# Patient Record
Sex: Female | Born: 1950
Health system: Southern US, Community
[De-identification: ages and names within clinical notes are randomized; demographics above are authoritative.]

## PROBLEM LIST (undated history)

## (undated) DIAGNOSIS — R42 Dizziness and giddiness: Secondary | ICD-10-CM

## (undated) DIAGNOSIS — G4733 Obstructive sleep apnea (adult) (pediatric): Secondary | ICD-10-CM

## (undated) DIAGNOSIS — G4730 Sleep apnea, unspecified: Secondary | ICD-10-CM

## (undated) DIAGNOSIS — M1990 Unspecified osteoarthritis, unspecified site: Secondary | ICD-10-CM

## (undated) DIAGNOSIS — H9190 Unspecified hearing loss, unspecified ear: Secondary | ICD-10-CM

## (undated) DIAGNOSIS — I8390 Asymptomatic varicose veins of unspecified lower extremity: Secondary | ICD-10-CM

## (undated) DIAGNOSIS — I10 Essential (primary) hypertension: Secondary | ICD-10-CM

## (undated) DIAGNOSIS — K3184 Gastroparesis: Secondary | ICD-10-CM

## (undated) DIAGNOSIS — R32 Unspecified urinary incontinence: Secondary | ICD-10-CM

## (undated) DIAGNOSIS — F419 Anxiety disorder, unspecified: Secondary | ICD-10-CM

## (undated) DIAGNOSIS — M797 Fibromyalgia: Secondary | ICD-10-CM

## (undated) DIAGNOSIS — H269 Unspecified cataract: Secondary | ICD-10-CM

## (undated) DIAGNOSIS — Z9989 Dependence on other enabling machines and devices: Secondary | ICD-10-CM

## (undated) DIAGNOSIS — I4891 Unspecified atrial fibrillation: Secondary | ICD-10-CM

## (undated) DIAGNOSIS — H353190 Nonexudative age-related macular degeneration, unspecified eye, stage unspecified: Secondary | ICD-10-CM

## (undated) DIAGNOSIS — K635 Polyp of colon: Secondary | ICD-10-CM

## (undated) DIAGNOSIS — Z5189 Encounter for other specified aftercare: Secondary | ICD-10-CM

## (undated) DIAGNOSIS — E785 Hyperlipidemia, unspecified: Secondary | ICD-10-CM

## (undated) DIAGNOSIS — K279 Peptic ulcer, site unspecified, unspecified as acute or chronic, without hemorrhage or perforation: Secondary | ICD-10-CM

## (undated) DIAGNOSIS — E669 Obesity, unspecified: Secondary | ICD-10-CM

## (undated) DIAGNOSIS — K219 Gastro-esophageal reflux disease without esophagitis: Secondary | ICD-10-CM

## (undated) DIAGNOSIS — K5790 Diverticulosis of intestine, part unspecified, without perforation or abscess without bleeding: Secondary | ICD-10-CM

## (undated) HISTORY — PX: BILROTH I PROCEDURE: SHX1231

## (undated) HISTORY — PX: COLONOSCOPY: SHX174

## (undated) HISTORY — DX: Gastro-esophageal reflux disease without esophagitis: K21.9

## (undated) HISTORY — PX: TOTAL ABDOMINAL HYSTERECTOMY: SHX209

## (undated) HISTORY — DX: Unspecified hearing loss, unspecified ear: H91.90

## (undated) HISTORY — DX: Sleep apnea, unspecified: G47.30

## (undated) HISTORY — PX: CATARACT EXTRACTION: SUR2

## (undated) HISTORY — DX: Polyp of colon: K63.5

## (undated) HISTORY — DX: Essential (primary) hypertension: I10

## (undated) HISTORY — DX: Obesity, unspecified: E66.9

## (undated) HISTORY — PX: LAPAROSCOPIC TUBAL LIGATION: SUR803

## (undated) HISTORY — DX: Diverticulosis of intestine, part unspecified, without perforation or abscess without bleeding: K57.90

## (undated) HISTORY — DX: Dizziness and giddiness: R42

## (undated) HISTORY — DX: Unspecified osteoarthritis, unspecified site: M19.90

## (undated) HISTORY — DX: Nonexudative age-related macular degeneration, unspecified eye, stage unspecified: H35.3190

## (undated) HISTORY — PX: TONSILLECTOMY: SUR1361

## (undated) HISTORY — DX: Anxiety disorder, unspecified: F41.9

## (undated) HISTORY — DX: Encounter for other specified aftercare: Z51.89

## (undated) HISTORY — DX: Unspecified atrial fibrillation: I48.91

## (undated) HISTORY — DX: Fibromyalgia: M79.7

## (undated) HISTORY — DX: Unspecified cataract: H26.9

## (undated) HISTORY — DX: Peptic ulcer, site unspecified, unspecified as acute or chronic, without hemorrhage or perforation: K27.9

## (undated) HISTORY — DX: Gastroparesis: K31.84

## (undated) HISTORY — DX: Hyperlipidemia, unspecified: E78.5

## (undated) HISTORY — PX: OTHER SURGICAL HISTORY: SHX169

## (undated) HISTORY — PX: COCHLEAR IMPLANT: SUR684

---
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
## 1970-12-23 HISTORY — PX: CHOLECYSTECTOMY: SHX55

## 2001-06-05 ENCOUNTER — Encounter: Admission: RE | Admit: 2001-06-05 | Discharge: 2001-06-05 | Payer: Self-pay | Admitting: Gastroenterology

## 2001-06-05 ENCOUNTER — Encounter: Payer: Self-pay | Admitting: Gastroenterology

## 2001-09-17 ENCOUNTER — Encounter (INDEPENDENT_AMBULATORY_CARE_PROVIDER_SITE_OTHER): Payer: Self-pay | Admitting: Specialist

## 2001-09-17 ENCOUNTER — Encounter (INDEPENDENT_AMBULATORY_CARE_PROVIDER_SITE_OTHER): Payer: Self-pay | Admitting: *Deleted

## 2001-09-17 ENCOUNTER — Other Ambulatory Visit: Admission: RE | Admit: 2001-09-17 | Discharge: 2001-09-17 | Payer: Self-pay | Admitting: Gastroenterology

## 2001-11-12 ENCOUNTER — Encounter: Admission: RE | Admit: 2001-11-12 | Discharge: 2001-11-12 | Payer: Self-pay | Admitting: Gastroenterology

## 2001-11-12 ENCOUNTER — Encounter: Payer: Self-pay | Admitting: Gastroenterology

## 2002-01-06 ENCOUNTER — Other Ambulatory Visit: Admission: RE | Admit: 2002-01-06 | Discharge: 2002-01-06 | Payer: Self-pay | Admitting: Obstetrics and Gynecology

## 2002-01-11 ENCOUNTER — Encounter: Payer: Self-pay | Admitting: Critical Care Medicine

## 2002-05-04 ENCOUNTER — Inpatient Hospital Stay (HOSPITAL_COMMUNITY): Admission: AD | Admit: 2002-05-04 | Discharge: 2002-05-08 | Payer: Self-pay | Admitting: Family Medicine

## 2002-05-04 ENCOUNTER — Encounter: Payer: Self-pay | Admitting: Family Medicine

## 2002-05-06 ENCOUNTER — Encounter: Payer: Self-pay | Admitting: Internal Medicine

## 2002-06-07 ENCOUNTER — Inpatient Hospital Stay (HOSPITAL_COMMUNITY): Admission: EM | Admit: 2002-06-07 | Discharge: 2002-06-10 | Payer: Self-pay

## 2002-08-17 ENCOUNTER — Ambulatory Visit (HOSPITAL_COMMUNITY): Admission: RE | Admit: 2002-08-17 | Discharge: 2002-08-17 | Payer: Self-pay | Admitting: *Deleted

## 2002-08-17 ENCOUNTER — Encounter: Payer: Self-pay | Admitting: *Deleted

## 2002-10-12 ENCOUNTER — Ambulatory Visit (HOSPITAL_COMMUNITY): Admission: RE | Admit: 2002-10-12 | Discharge: 2002-10-12 | Payer: Self-pay | Admitting: *Deleted

## 2003-01-11 ENCOUNTER — Ambulatory Visit (HOSPITAL_COMMUNITY): Admission: RE | Admit: 2003-01-11 | Discharge: 2003-01-11 | Payer: Self-pay | Admitting: Family Medicine

## 2003-01-11 ENCOUNTER — Encounter: Payer: Self-pay | Admitting: Family Medicine

## 2003-02-21 ENCOUNTER — Encounter: Payer: Self-pay | Admitting: Family Medicine

## 2003-02-21 ENCOUNTER — Ambulatory Visit (HOSPITAL_COMMUNITY): Admission: RE | Admit: 2003-02-21 | Discharge: 2003-02-21 | Payer: Self-pay | Admitting: Family Medicine

## 2003-06-07 ENCOUNTER — Encounter: Payer: Self-pay | Admitting: Family Medicine

## 2003-06-07 ENCOUNTER — Ambulatory Visit (HOSPITAL_COMMUNITY): Admission: RE | Admit: 2003-06-07 | Discharge: 2003-06-07 | Payer: Self-pay | Admitting: Family Medicine

## 2003-06-22 ENCOUNTER — Encounter: Payer: Self-pay | Admitting: Family Medicine

## 2003-06-22 ENCOUNTER — Ambulatory Visit (HOSPITAL_COMMUNITY): Admission: RE | Admit: 2003-06-22 | Discharge: 2003-06-22 | Payer: Self-pay | Admitting: Family Medicine

## 2003-07-19 ENCOUNTER — Ambulatory Visit (HOSPITAL_COMMUNITY): Admission: RE | Admit: 2003-07-19 | Discharge: 2003-07-19 | Payer: Self-pay | Admitting: Family Medicine

## 2003-07-19 ENCOUNTER — Encounter: Payer: Self-pay | Admitting: Family Medicine

## 2003-08-03 ENCOUNTER — Encounter: Payer: Self-pay | Admitting: Orthopedic Surgery

## 2003-08-03 ENCOUNTER — Ambulatory Visit (HOSPITAL_COMMUNITY): Admission: RE | Admit: 2003-08-03 | Discharge: 2003-08-03 | Payer: Self-pay | Admitting: Orthopedic Surgery

## 2003-08-22 ENCOUNTER — Encounter: Admission: RE | Admit: 2003-08-22 | Discharge: 2003-08-22 | Payer: Self-pay | Admitting: Orthopedic Surgery

## 2003-08-22 ENCOUNTER — Encounter: Payer: Self-pay | Admitting: Orthopedic Surgery

## 2003-09-12 ENCOUNTER — Ambulatory Visit (HOSPITAL_COMMUNITY): Admission: RE | Admit: 2003-09-12 | Discharge: 2003-09-12 | Payer: Self-pay | Admitting: Orthopedic Surgery

## 2003-09-12 ENCOUNTER — Encounter: Payer: Self-pay | Admitting: Orthopedic Surgery

## 2004-02-02 ENCOUNTER — Emergency Department (HOSPITAL_COMMUNITY): Admission: EM | Admit: 2004-02-02 | Discharge: 2004-02-02 | Payer: Self-pay | Admitting: Emergency Medicine

## 2004-03-12 ENCOUNTER — Emergency Department (HOSPITAL_COMMUNITY): Admission: EM | Admit: 2004-03-12 | Discharge: 2004-03-12 | Payer: Self-pay | Admitting: Emergency Medicine

## 2004-04-19 ENCOUNTER — Ambulatory Visit (HOSPITAL_COMMUNITY): Admission: RE | Admit: 2004-04-19 | Discharge: 2004-04-19 | Payer: Self-pay | Admitting: Family Medicine

## 2004-04-27 ENCOUNTER — Ambulatory Visit (HOSPITAL_COMMUNITY): Admission: RE | Admit: 2004-04-27 | Discharge: 2004-04-27 | Payer: Self-pay | Admitting: Urology

## 2004-05-08 ENCOUNTER — Encounter (HOSPITAL_COMMUNITY): Admission: RE | Admit: 2004-05-08 | Discharge: 2004-06-07 | Payer: Self-pay | Admitting: Family Medicine

## 2004-09-17 ENCOUNTER — Emergency Department (HOSPITAL_COMMUNITY): Admission: EM | Admit: 2004-09-17 | Discharge: 2004-09-17 | Payer: Self-pay | Admitting: Emergency Medicine

## 2004-11-14 ENCOUNTER — Ambulatory Visit: Payer: Self-pay | Admitting: Orthopedic Surgery

## 2005-03-15 ENCOUNTER — Ambulatory Visit (HOSPITAL_COMMUNITY): Admission: RE | Admit: 2005-03-15 | Discharge: 2005-03-15 | Payer: Self-pay | Admitting: Family Medicine

## 2005-08-07 ENCOUNTER — Ambulatory Visit: Payer: Self-pay | Admitting: Gastroenterology

## 2005-08-29 ENCOUNTER — Ambulatory Visit: Payer: Self-pay | Admitting: Gastroenterology

## 2005-09-12 ENCOUNTER — Encounter (INDEPENDENT_AMBULATORY_CARE_PROVIDER_SITE_OTHER): Payer: Self-pay | Admitting: *Deleted

## 2005-09-12 ENCOUNTER — Ambulatory Visit: Payer: Self-pay | Admitting: Gastroenterology

## 2005-09-17 ENCOUNTER — Ambulatory Visit (HOSPITAL_COMMUNITY): Admission: RE | Admit: 2005-09-17 | Discharge: 2005-09-17 | Payer: Self-pay | Admitting: Family Medicine

## 2006-02-14 ENCOUNTER — Emergency Department (HOSPITAL_COMMUNITY): Admission: EM | Admit: 2006-02-14 | Discharge: 2006-02-14 | Payer: Self-pay | Admitting: Emergency Medicine

## 2006-06-05 ENCOUNTER — Ambulatory Visit: Payer: Self-pay | Admitting: Orthopedic Surgery

## 2008-06-06 ENCOUNTER — Ambulatory Visit (HOSPITAL_COMMUNITY): Admission: RE | Admit: 2008-06-06 | Discharge: 2008-06-06 | Payer: Self-pay | Admitting: Family Medicine

## 2009-01-06 ENCOUNTER — Emergency Department (HOSPITAL_COMMUNITY): Admission: EM | Admit: 2009-01-06 | Discharge: 2009-01-06 | Payer: Self-pay | Admitting: Emergency Medicine

## 2009-01-10 ENCOUNTER — Encounter: Payer: Self-pay | Admitting: Cardiology

## 2009-01-10 LAB — CONVERTED CEMR LAB
ALT: 12 units/L
Albumin: 4.2 g/dL
Bilirubin, Direct: 0.4 mg/dL
CO2: 24 meq/L
Cholesterol: 227 mg/dL
Glucose, Bld: 79 mg/dL
HCT: 44.6 %
Hemoglobin: 14.7 g/dL
LDL Cholesterol: 142 mg/dL
Potassium: 4.3 meq/L
Sodium: 142 meq/L
Total Protein: 7 g/dL
WBC: 9.3 10*3/uL

## 2009-01-17 ENCOUNTER — Ambulatory Visit: Payer: Self-pay | Admitting: Cardiology

## 2009-01-24 ENCOUNTER — Emergency Department (HOSPITAL_COMMUNITY): Admission: EM | Admit: 2009-01-24 | Discharge: 2009-01-24 | Payer: Self-pay | Admitting: Emergency Medicine

## 2009-01-25 ENCOUNTER — Emergency Department (HOSPITAL_COMMUNITY): Admission: EM | Admit: 2009-01-25 | Discharge: 2009-01-25 | Payer: Self-pay | Admitting: Emergency Medicine

## 2009-02-07 ENCOUNTER — Ambulatory Visit: Payer: Self-pay | Admitting: Cardiology

## 2009-03-07 ENCOUNTER — Ambulatory Visit: Payer: Self-pay | Admitting: Cardiology

## 2009-03-07 LAB — CONVERTED CEMR LAB
BUN: 26 mg/dL
Calcium: 9 mg/dL
Creatinine, Ser: 0.94 mg/dL

## 2009-09-14 DIAGNOSIS — E669 Obesity, unspecified: Secondary | ICD-10-CM | POA: Insufficient documentation

## 2009-09-14 DIAGNOSIS — I10 Essential (primary) hypertension: Secondary | ICD-10-CM | POA: Insufficient documentation

## 2009-09-14 DIAGNOSIS — G4733 Obstructive sleep apnea (adult) (pediatric): Secondary | ICD-10-CM | POA: Insufficient documentation

## 2009-09-14 DIAGNOSIS — K279 Peptic ulcer, site unspecified, unspecified as acute or chronic, without hemorrhage or perforation: Secondary | ICD-10-CM | POA: Insufficient documentation

## 2009-09-14 DIAGNOSIS — M1990 Unspecified osteoarthritis, unspecified site: Secondary | ICD-10-CM | POA: Insufficient documentation

## 2009-09-14 DIAGNOSIS — IMO0001 Reserved for inherently not codable concepts without codable children: Secondary | ICD-10-CM | POA: Insufficient documentation

## 2009-09-14 DIAGNOSIS — R42 Dizziness and giddiness: Secondary | ICD-10-CM | POA: Insufficient documentation

## 2009-09-19 ENCOUNTER — Ambulatory Visit: Payer: Self-pay | Admitting: Cardiology

## 2009-09-22 ENCOUNTER — Encounter: Payer: Self-pay | Admitting: Cardiology

## 2009-09-22 ENCOUNTER — Encounter (INDEPENDENT_AMBULATORY_CARE_PROVIDER_SITE_OTHER): Payer: Self-pay | Admitting: *Deleted

## 2009-09-22 LAB — CONVERTED CEMR LAB
ALT: 13 units/L (ref 0–35)
AST: 21 units/L (ref 0–37)
Albumin: 3.8 g/dL (ref 3.5–5.2)
Alkaline Phosphatase: 89 units/L
CO2: 22 meq/L
CO2: 22 meq/L (ref 19–32)
Calcium: 8.7 mg/dL (ref 8.4–10.5)
Chloride: 105 meq/L (ref 96–112)
Cholesterol: 210 mg/dL
Cholesterol: 210 mg/dL — ABNORMAL HIGH (ref 0–200)
Creatinine, Ser: 1.45 mg/dL
Creatinine, Ser: 1.45 mg/dL — ABNORMAL HIGH (ref 0.40–1.20)
Eosinophils Absolute: 0 10*3/uL (ref 0.0–0.7)
Glucose, Bld: 90 mg/dL
Hemoglobin: 13.5 g/dL
LDL Cholesterol: 121 mg/dL
Lymphocytes Relative: 30 % (ref 12–46)
Lymphs Abs: 2.6 10*3/uL (ref 0.7–4.0)
MCV: 95.4 fL (ref 78.0–100.0)
Monocytes Relative: 7 % (ref 3–12)
Neutrophils Relative %: 62 % (ref 43–77)
Platelets: 322 10*3/uL
Potassium: 4.4 meq/L (ref 3.5–5.3)
RBC: 4.32 M/uL (ref 3.87–5.11)
Sodium: 141 meq/L (ref 135–145)
Total Protein: 6.6 g/dL
Total Protein: 6.6 g/dL (ref 6.0–8.3)
WBC: 8.4 10*3/uL (ref 4.0–10.5)

## 2009-10-02 ENCOUNTER — Ambulatory Visit (HOSPITAL_COMMUNITY): Admission: RE | Admit: 2009-10-02 | Discharge: 2009-10-02 | Payer: Self-pay | Admitting: Cardiology

## 2009-10-02 ENCOUNTER — Encounter (INDEPENDENT_AMBULATORY_CARE_PROVIDER_SITE_OTHER): Payer: Self-pay | Admitting: *Deleted

## 2010-01-01 ENCOUNTER — Ambulatory Visit: Payer: Self-pay | Admitting: Internal Medicine

## 2010-01-01 ENCOUNTER — Encounter (INDEPENDENT_AMBULATORY_CARE_PROVIDER_SITE_OTHER): Payer: Self-pay | Admitting: *Deleted

## 2010-01-01 DIAGNOSIS — Z860100 Personal history of colon polyps, unspecified: Secondary | ICD-10-CM | POA: Insufficient documentation

## 2010-01-01 DIAGNOSIS — F5089 Other specified eating disorder: Secondary | ICD-10-CM | POA: Insufficient documentation

## 2010-01-01 DIAGNOSIS — K5730 Diverticulosis of large intestine without perforation or abscess without bleeding: Secondary | ICD-10-CM | POA: Insufficient documentation

## 2010-01-01 LAB — CONVERTED CEMR LAB
HCT: 42.9 %
Hemoglobin: 14.2 g/dL
MCV: 94.3 fL
Platelets: 340 10*3/uL

## 2010-01-02 LAB — CONVERTED CEMR LAB
Basophils Absolute: 0.1 10*3/uL (ref 0.0–0.1)
Ferritin: 124.4 ng/mL (ref 10.0–291.0)
HCT: 42.9 % (ref 36.0–46.0)
Hemoglobin: 14.2 g/dL (ref 12.0–15.0)
Iron: 89 ug/dL (ref 42–145)
Lymphs Abs: 2.7 10*3/uL (ref 0.7–4.0)
MCHC: 33.1 g/dL (ref 30.0–36.0)
MCV: 94.3 fL (ref 78.0–100.0)
RBC: 4.55 M/uL (ref 3.87–5.11)
Vitamin B-12: 629 pg/mL (ref 211–911)
WBC: 8.9 10*3/uL (ref 4.5–10.5)

## 2010-02-12 ENCOUNTER — Encounter (INDEPENDENT_AMBULATORY_CARE_PROVIDER_SITE_OTHER): Payer: Self-pay | Admitting: *Deleted

## 2010-02-12 LAB — CONVERTED CEMR LAB
AST: 14 units/L
Albumin: 4.1 g/dL
Alkaline Phosphatase: 80 units/L
Chloride: 100 meq/L
Glomerular Filtration Rate, Af Am: 50 mL/min/{1.73_m2}
HDL: 35 mg/dL
LDL Cholesterol: 134 mg/dL
Potassium: 4.2 meq/L
TSH: 2.251 microintl units/mL
Total Protein: 6.8 g/dL
WBC: 8 10*3/uL

## 2010-03-12 ENCOUNTER — Encounter (INDEPENDENT_AMBULATORY_CARE_PROVIDER_SITE_OTHER): Payer: Self-pay | Admitting: *Deleted

## 2010-03-12 LAB — CONVERTED CEMR LAB
Calcium: 9.3 mg/dL
GFR calc non Af Amer: >60 mL/min
Glomerular Filtration Rate, Af Am: >60 mL/min/{1.73_m2}
Sodium: 142 meq/L

## 2010-04-19 ENCOUNTER — Encounter (INDEPENDENT_AMBULATORY_CARE_PROVIDER_SITE_OTHER): Payer: Self-pay | Admitting: *Deleted

## 2010-04-20 ENCOUNTER — Encounter (INDEPENDENT_AMBULATORY_CARE_PROVIDER_SITE_OTHER): Payer: Self-pay | Admitting: *Deleted

## 2010-08-03 ENCOUNTER — Ambulatory Visit: Payer: Self-pay | Admitting: Cardiology

## 2010-08-03 DIAGNOSIS — R0602 Shortness of breath: Secondary | ICD-10-CM | POA: Insufficient documentation

## 2010-08-03 DIAGNOSIS — I4891 Unspecified atrial fibrillation: Secondary | ICD-10-CM | POA: Insufficient documentation

## 2010-08-03 DIAGNOSIS — I119 Hypertensive heart disease without heart failure: Secondary | ICD-10-CM | POA: Insufficient documentation

## 2010-08-03 DIAGNOSIS — E785 Hyperlipidemia, unspecified: Secondary | ICD-10-CM | POA: Insufficient documentation

## 2010-08-13 ENCOUNTER — Encounter: Payer: Self-pay | Admitting: Cardiology

## 2010-08-13 ENCOUNTER — Ambulatory Visit (HOSPITAL_COMMUNITY): Admission: RE | Admit: 2010-08-13 | Discharge: 2010-08-13 | Payer: Self-pay | Admitting: Cardiology

## 2010-08-13 ENCOUNTER — Ambulatory Visit: Payer: Self-pay | Admitting: Cardiovascular Disease

## 2010-08-14 ENCOUNTER — Encounter: Payer: Self-pay | Admitting: Cardiology

## 2010-08-14 ENCOUNTER — Ambulatory Visit (HOSPITAL_COMMUNITY): Admission: RE | Admit: 2010-08-14 | Discharge: 2010-08-14 | Payer: Self-pay | Admitting: Cardiology

## 2010-08-14 ENCOUNTER — Encounter: Payer: Self-pay | Admitting: *Deleted

## 2010-08-14 LAB — CONVERTED CEMR LAB
ALT: 21 units/L
ALT: 21 units/L (ref 0–35)
AST: 27 units/L
AST: 27 units/L (ref 0–37)
Alkaline Phosphatase: 78 units/L (ref 39–117)
BUN: 19 mg/dL
BUN: 19 mg/dL (ref 6–23)
Basophils Absolute: 0 10*3/uL
Basophils Absolute: 0 10*3/uL (ref 0.0–0.1)
Basophils Relative: 0 % (ref 0–1)
Brain Natriuretic Peptide: 22
Calcium: 8.9 mg/dL (ref 8.4–10.5)
Chloride: 104 meq/L
Chloride: 104 meq/L (ref 96–112)
Creatinine, Ser: 1.15 mg/dL
Creatinine, Ser: 1.15 mg/dL (ref 0.40–1.20)
Eosinophils Absolute: 0 10*3/uL (ref 0.0–0.7)
Glucose, Bld: 104 mg/dL
HDL: 34 mg/dL
HDL: 34 mg/dL — ABNORMAL LOW (ref >39)
Hemoglobin: 14.2 g/dL (ref 12.0–15.0)
LDL Cholesterol: 137 mg/dL — ABNORMAL HIGH (ref 0–99)
Lymphocytes Relative: 32 %
Lymphs Abs: 2.1 10*3/uL
MCHC: 32.1 g/dL
MCHC: 32.1 g/dL (ref 30.0–36.0)
MCV: 95.7 fL (ref 78.0–100.0)
Monocytes Absolute: 0.3 10*3/uL (ref 0.1–1.0)
Neutro Abs: 4 10*3/uL (ref 1.7–7.7)
Neutrophils Relative %: 62 % (ref 43–77)
Pro B Natriuretic peptide (BNP): 22 pg/mL (ref 0.0–100.0)
RDW: 13.4 %
RDW: 13.4 % (ref 11.5–15.5)
TSH: 1.43 microintl units/mL
TSH: 1.43 microintl units/mL (ref 0.350–4.500)
Total CHOL/HDL Ratio: 6.6
Triglycerides: 271 mg/dL
VLDL: 54 mg/dL — ABNORMAL HIGH (ref 0–40)

## 2010-08-16 ENCOUNTER — Encounter: Payer: Self-pay | Admitting: Cardiology

## 2010-08-21 ENCOUNTER — Ambulatory Visit: Payer: Self-pay | Admitting: Critical Care Medicine

## 2010-09-09 ENCOUNTER — Ambulatory Visit
Admission: RE | Admit: 2010-09-09 | Discharge: 2010-09-09 | Payer: Self-pay | Source: Home / Self Care | Admitting: Critical Care Medicine

## 2010-09-09 ENCOUNTER — Encounter: Payer: Self-pay | Admitting: Critical Care Medicine

## 2010-09-11 ENCOUNTER — Ambulatory Visit: Payer: Self-pay | Admitting: Pulmonary Disease

## 2010-09-13 ENCOUNTER — Ambulatory Visit: Payer: Self-pay | Admitting: Critical Care Medicine

## 2010-09-14 ENCOUNTER — Telehealth: Payer: Self-pay | Admitting: Critical Care Medicine

## 2010-09-14 ENCOUNTER — Encounter: Payer: Self-pay | Admitting: Critical Care Medicine

## 2010-09-27 ENCOUNTER — Telehealth: Payer: Self-pay | Admitting: Critical Care Medicine

## 2010-11-27 ENCOUNTER — Ambulatory Visit: Payer: Self-pay | Admitting: Critical Care Medicine

## 2010-11-30 ENCOUNTER — Ambulatory Visit: Payer: Self-pay | Admitting: Cardiovascular Disease

## 2010-11-30 ENCOUNTER — Encounter: Payer: Self-pay | Admitting: Adult Health

## 2011-01-12 ENCOUNTER — Encounter: Payer: Self-pay | Admitting: Family Medicine

## 2011-01-13 ENCOUNTER — Encounter: Payer: Self-pay | Admitting: Family Medicine

## 2011-01-24 NOTE — Miscellaneous (Signed)
Summary: Orders Update six min charge   Clinical Lists Changes  Orders: Added new Service order of Pulmonary Stress (6 min walk) (16109) - Signed

## 2011-01-24 NOTE — Miscellaneous (Signed)
Summary: Orders Update  Clinical Lists Changes  Orders: Added new Referral order of DME Referral (DME) - Signed 

## 2011-01-24 NOTE — Procedures (Signed)
Summary: Colonoscopy:Diverticulosis, Hemorrhoids   Colonoscopy  Procedure date:  09/12/2005  Findings:      Results: Hemorrhoids.     Results: Diverticulosis.       Location:  [facility].    Procedures Next Due Date:    Colonoscopy: 09/2010  Patient Name: [name], [name] MRN:  Procedure Procedures: Colonoscopy CPT: 04540.  Personnel: Endoscopist: Ulyess Mort, MD.  Exam Location: Exam performed in Outpatient Clinic. Outpatient  Patient Consent: Procedure, Alternatives, Risks and Benefits discussed, consent obtained, from patient. Consent was obtained by the RN.  Indications  Surveillance of: Adenomatous Polyp(s).  History  Current Medications: Patient is not currently taking Coumadin.  Pre-Exam Physical: Entire physical exam was normal.  Exam Exam: Extent of exam reached: Cecum, extent intended: Cecum.  The cecum was identified by appendiceal orifice and IC valve. Colon retroflexion performed. Images taken. ASA Classification: II. Tolerance: excellent.  Monitoring: Pulse and BP monitoring, Oximetry used. Supplemental O2 given.  Sedation Meds: Patient assessed and found to be appropriate for moderate (conscious) sedation. Fentanyl given IV. Versed given IV.  Findings - DIVERTICULOSIS: Transverse Colon to Sigmoid Colon. ICD9: Diverticulosis: 562.10. Comments: mild--mod.  POLYP: Sigmoid Colon, Maximum size: 2 mm. sessile polyp. Procedure:  hot biopsy, removed, not retrieved,  POLYP: Sigmoid Colon, Maximum size: 2 mm. sessile polyp. Procedure:  hot biopsy, removed, not retrieved,  POLYP: Sigmoid Colon, Maximum size: 2 mm. sessile polyp. Procedure:  hot biopsy, removed, not retrieved,  - HEMORRHOIDS: Internal and External. Size: Grade I. ICD9: Hemorrhoids, Internal and  External: 455.6.   Assessment Abnormal examination, see findings above.  Diagnoses: 562.10: Diverticulosis.  455.6: Hemorrhoids, Internal and  External.    Events  Unplanned Interventions: No intervention was required.  Unplanned Events: There were no complications. Plans Medication Plan: Continue current medications.  Patient Education: Patient given standard instructions for: Polyps. Diverticulosis. Hemorrhoids. Yearly hemoccult testing recommended. Patient instructed to get routine colonoscopy every 5 years.  Disposition: After procedure patient sent to recovery. After recovery patient sent home.   CC:   Kari Baars, MD  This report was created from the original endoscopy report, which was reviewed and signed by the above listed endoscopist.

## 2011-01-24 NOTE — Assessment & Plan Note (Signed)
Summary: f/u...em    History of Present Illness Visit Type: consult  Primary GI MD: Stan Head MD [facility] Primary Provider: Assunta Found, MD  Requesting Provider: Gerrit Friends. Dietrich Pates, MD  Chief Complaint: Constipation and diarrhea  History of Present Illness:   60 YO FEMALE ,FORMER PT OF DR. SAM  WHO IS S/P REMOTE BILROTH I FOR ULCER DISEASE. SHE HAD A REVISION IN 2002 PER DR JENKINS IN [place] (?B-II ). SHE HAS HX OF COLON POLYPS (HYPERPLASTIC) ,LAST COLON 08/2005 WITH SEVERAL TINY POLYPS REMOVED BUT NOT RETRIEVED. SHE IS ON COUMADIN FOR ATRIAL FIB AND HAS SEVERAL OTHER MEDICAL PROBLEMS. SHE IS REFERRED TODAY PER DR ROTHBART. SHE RELATED FEW MONTH HX OF CRAVING POPSICLES,AND HAS A NEW AVERSION TO MEAT WITH NAUSEA. SHE REMEMBERS CRAVING ICE BEFORE HER ULCER SURGERY. APPETITE OK, WT DOWN A FEW POUNDS OVER THE PAST SEVERAL MONTHS. SHE GETS INTERMITTENT EPISODES OF ABDOMINAL DISTENTION, PAIN,NAUSEA AND VOMITING WHICH SHE HAS HAD FOR YEARS-SOMETIMES CAN ABORT WITH REGLAN  AND ROBINUL. HER BOWELS TEND MORE TOWARD CONSTIPATION,USES STOOL SOFTENER AS NEEDED. NO MELENA OR HEME. SHE DOES TAKE CELEBREX CHRONICALLY.   GI Review of Systems    Reports nausea and  weight loss.   Weight loss of 10 pounds over 8 MONTHS.   Denies abdominal pain, acid reflux, belching, bloating, chest pain, dysphagia with liquids, dysphagia with solids, heartburn, loss of appetite, vomiting, vomiting blood, and  weight gain.      Reports constipation and  diarrhea.     Denies anal fissure, black tarry stools, change in bowel habit, diverticulosis, fecal incontinence, heme positive stool, hemorrhoids, irritable bowel syndrome, jaundice, light color stool, liver problems, rectal bleeding, and  rectal pain.    Current Medications (verified): 1)  Benicar 40 Mg Tabs (Olmesartan Medoxomil) .... Take 1 Tab Daily 2)  Prozac 40 Mg Caps (Fluoxetine Hcl) .... Take 1 Tab Daily 3)  Fentanyl 50 Mcg/hr Pt72 (Fentanyl) .... Q72  Hrs 4)  Tizanidine Hcl 2 Mg Tabs (Tizanidine Hcl) .... Take 2 Tabs Daily 5)  Alprazolam 0.5 Mg Tabs (Alprazolam) .... Take As Needed 6)  Reglan 5 Mg Tabs (Metoclopramide Hcl) .... Take As Needed 7)  Chlorthalidone 25 Mg Tabs (Chlorthalidone) .... Take 1 Tab Daily 8)  Warfarin Sodium 4 Mg Tabs (Warfarin Sodium) .... Take As Directed By Coumadin Clinic 9)  Celebrex 100 Mg Caps (Celecoxib) .... Take 1 Tab Two Times A Day 10)  Glycopyrrolate 2 Mg Tabs (Glycopyrrolate) .... Take As Needed 11)  Hydrocodone-Acetaminophen 5-500 Mg Tabs (Hydrocodone-Acetaminophen) .... Every Eight Hours As Needed 12)  Carafate 1 Gm Tabs (Sucralfate) .... As Needed  Allergies (verified): 1)  ! Penicillin 2)  ! Codeine  Past History:  Past Medical History: FIBRILLATION, ATRIAL (ICD-427.31)-not documented DEGENERATIVE JOINT DISEASE (ICD-715.90) OBESITY (ICD-278.00) VERTIGO (ICD-780.4) SLEEP APNEA (ICD-780.57) HYPERTENSION (ICD-401.9) FIBROMYALGIA (ICD-729.1) PUD (ICD-533.90) HYPERPLASTIC COLON POLYPS DIVERTICULOSIS Anxiety Disorder  Past Surgical History: appendectomy S/P BILROTH -I 1970S FOR PUD,HAD REVISION IN 2002 (JENKINS ,[place]) cholecystectomy BTL TAH cataract surgery  Family History: Father:deceased  Mother:alive and well Siblings:2 brothers alive and well 2 sisters 1 has emphyzemia 1 deceased due to car accident patient has 4 children alive and well Family History of Breast Cancer: Maternal Aunt  Stomach Cancer: MGM and Maternal Aunt  Family History of Diabetes: Father  Family History of Heart Disease: PGM No FH of Colon Cancer:  Social History: Disabled  Married  Tobacco Use - No.  Alcohol Use - no Regular Exercise - no Drug Use -  no Daily Caffeine Use: 3-4 weekly   Review of Systems       The patient complains of anxiety-new, arthritis/joint pain, back pain, and urine leakage.  The patient denies allergy/sinus, anemia, blood in urine, change in vision, confusion, cough,  coughing up blood, depression-new, fainting, fatigue, fever, headaches-new, hearing problems, heart murmur, heart rhythm changes, itching, menstrual pain, muscle pains/cramps, night sweats, nosebleeds, pregnancy symptoms, shortness of breath, skin rash, sleeping problems, sore throat, swelling of feet/legs, thirst - excessive, urination - excessive, and urination changes/pain.         ROS OTHERWISE AS IN HPI  Vital Signs:  Patient profile:   60 year old female Height:      64 inches Weight:      263 pounds BMI:     45.31 BSA:     2.20 Pulse rate:   60 / minute Pulse rhythm:   regular BP sitting:   118 / 80  (left arm) Cuff size:   regular  Vitals Entered By: Ok Anis CMA (January 01, 2010 10:03 AM)  Physical Exam  General:  Well developed, well nourished, no acute distress. Head:  Normocephalic and atraumatic. Eyes:  PERRLA, no icterus. Ears:  decreased auditory acuity.,HEARING AIDE   Neck:  Supple; no masses or thyromegaly. Lungs:  Clear throughout to auscultation. Heart:  Regular rate and rhythm; no murmurs, rubs,  or bruits. Abdomen:  SOFT, NONTENDER, NO MASS OR HSM,NO SPLASH, MULTIPLE INCISIONAL SCARS,BS+ Rectal:  HEME NEGATIVE STOOL Extremities:  No clubbing, cyanosis, edema or deformities noted. Neurologic:  Alert and  oriented x4;  grossly normal neurologically. Psych:  Alert and cooperative. Normal mood and affect.   Impression & Recommendations:  Problem # 1:  PICA (ICD-307.52) Assessment New  60 YO FEMALE  S/P REMOTE BILROTH -I FOR PUD,THEN REVISION (?B-II) WITH  NEW  PICA- R/O IRON  DEFICIENCY. ?MALABSORPTION SECONDARY TO GASTRIC SURGERY VS INTERMITTENT BLOOD LOSS.  CBC,FE STUDIES,B12,FOLATE TODAY  EGD/COLONOSCOPY  -TIMING TO BE DETERMINED BASED ON LABS,PT WOULD LIKE TO WAIT UNTIL MARCH. WILL NEED TO COME OFF COUMADIN FOR COLON.  Orders: TLB-CBC Platelet - w/Differential (85025-CBCD) TLB-B12 + Folate Pnl ([id number]-A21/HYQ) TLB-Ferritin  (82728-FER) TLB-IBC Pnl (Iron/FE;Transferrin) (83550-IBC)  Problem # 2:  DIVERTICULOSIS-COLON (ICD-562.10) Assessment: Comment Only  Problem # 3:  COLONIC POLYPS, HX OF (ICD-V12.72) Assessment: Comment Only HYPERPLASTIC 2002,NOT RETRIEVED 2006.  Problem # 4:  NAUSEA AND VOMITING (ICD-787.01) Assessment: Unchanged  PT WITH CHRONIC INTERMITTENT NAUSEA AND VOMITING WITH ABDOMINAL DISTENTION-R/O GASTROPARESIS,PARTIAL OUTLET OBSTRUCTION,ADHESIONS  EGD TO BE DONE, SEE ABOVE  CONTINUE as needed REGALN AND ROBINUL WHICH HAS BEEN HELPFUL.  Orders: TLB-CBC Platelet - w/Differential (85025-CBCD) TLB-B12 + Folate Pnl ([id number]/UXL) TLB-Ferritin (82728-FER) TLB-IBC Pnl (Iron/FE;Transferrin) (83550-IBC)  Problem # 5:  COUMADIN THERAPY (ICD-V58.61) Assessment: Comment Only HX ATRIAL FIBRILLATION-WILL STOP PRE- COLONOSCOPY  Problem # 6:  SLEEP APNEA (ICD-780.57) Assessment: Comment Only  Problem # 7:  FIBROMYALGIA (ICD-729.1) Assessment: Comment Only  Patient Instructions: 1)  Please go to the basement to have your lab tests drawn today. 2)  We will call you with the results of your labs and schedule your procedures at that time. 3)  Copy sent to : [place] Bing, MD 4)  The medication list was reviewed and reconciled.  All changed / newly prescribed medications were explained.  A complete medication list was provided to the patient / caregiver.

## 2011-01-24 NOTE — Miscellaneous (Signed)
Summary: LABS BMP3/21/2011  Clinical Lists Changes  Observations: Added new observation of CALCIUM: 9.3 mg/dL (04/54/0981 19:14) Added new observation of GFR AA: >60 mL/min/1.21m2 (03/12/2010 10:29) Added new observation of GFR: >60 mL/min (03/12/2010 10:29) Added new observation of CREATININE: 1.14 mg/dL (78/29/5621 30:86) Added new observation of BUN: 27 mg/dL (57/84/6962 95:28) Added new observation of BG RANDOM: 91 mg/dL (41/32/4401 02:72) Added new observation of CO2 PLSM/SER: 29 meq/L (03/12/2010 10:29) Added new observation of CL SERUM: 102 meq/L (03/12/2010 10:29) Added new observation of K SERUM: 4.2 meq/L (03/12/2010 10:29) Added new observation of NA: 142 meq/L (03/12/2010 10:29)

## 2011-01-24 NOTE — Progress Notes (Signed)
Summary: appt  Phone Note Outgoing Call   Reason for Call: Confirm/change Appt Summary of Call: pls obtain for a pt an f/u appt with me in next few weeks  Initial call taken by: Storm Frisk MD,  September 14, 2010 2:40 PM  Follow-up for Phone Call        Called, spoke with pt.  Informed her per PW she needs ROV within the next few wks.  Pt requesting early am appt.  OV scheduled for 10.5.11 at 9am with PW -- pt aware.   Follow-up by: Gweneth Dimitri RN,  September 14, 2010 3:47 PM

## 2011-01-24 NOTE — Assessment & Plan Note (Signed)
Summary: PAST DUE FOR F/UI PER PT PHONE CALL/TG  Medications Added [facility] GLUCOSAMINE-CHONDROITIN 500-400 MG TABS (GLUCOSAMINE-CHONDROITIN) take 1 tab daily      Allergies Added:   Visit Type:  Follow-up Referring Provider:  Gerrit Friends. Dietrich Pates, MD  Primary Provider:  Assunta Found, MD   CC:  still some sob on exerction.  History of Present Illness: [name] is a friendly obese 60 y/o CM that we are following for continued assessment and treatment of atrial arrythmias, sleep apnea, hypertension.  She missed last scheduled appt secondary to illness in her family. She continues to do very well. She states CPAP has "changed her life."  She is more active, has lost 20lbs since last visit and is no longer short of breath.  She has complaints of bilateral knee pain and plans to see orthopedic surgeon after the first of the year.  She is otherwise without cardiac complaints.  Current Medications (verified): 1)  Benicar 40 Mg Tabs (Olmesartan Medoxomil) .... Take 1/2  Tab Daily 2)  Prozac 40 Mg Caps (Fluoxetine Hcl) .... Take 1 Tab Daily 3)  Fentanyl 50 Mcg/hr Pt72 (Fentanyl) .Marland Kitchen.. 1 Every 3 Days 4)  Tizanidine Hcl 2 Mg Tabs (Tizanidine Hcl) .... Take 2 Tabs Daily 5)  Alprazolam 0.5 Mg Tabs (Alprazolam) .... Take As Needed 6)  Reglan 5 Mg Tabs (Metoclopramide Hcl) .... Take As Needed 7)  Chlorthalidone 25 Mg Tabs (Chlorthalidone) .... Take 1 Tab Daily 8)  Celebrex 200 Mg Caps (Celecoxib) .... Take 1 Capsule By Mouth Two Times A Day 9)  Glycopyrrolate 2 Mg Tabs (Glycopyrrolate) .... Take As Needed 10)  Hydrocodone-Acetaminophen 5-500 Mg Tabs (Hydrocodone-Acetaminophen) .... Every Six Hours As Needed 11)  Prilosec 40 Mg Cpdr (Omeprazole) .Marland Kitchen.. 1 At Bedtime 12)  Omega-3 Krill Oil 300 Mg Caps (Krill Oil) .... Take 1 Tab Daily 13)  Calcium-Vitamin D 600-200 Mg-Unit Tabs (Calcium-Vitamin D) .... Take 1 Tablet By Mouth Once A Day 14)  Co Q-10 150 Mg Caps (Coenzyme Q10) .... Take 1 Tab Daily 15)   Vitamin B-12 1000 Mcg Tabs (Cyanocobalamin) .... Take 1 Tab Daily 16)  Nulev 0.125 Mg Tbdp (Hyoscyamine Sulfate) .... Take As Needed 17)  Benadryl 25 Mg Tabs (Diphenhydramine Hcl) .... Take As Needed 18)  Robinul-Forte 2 Mg Tabs (Glycopyrrolate) .... Take As Needed 19)  [facility] Glucosamine-Chondroitin 500-400 Mg Tabs (Glucosamine-Chondroitin) .... Take 1 Tab Daily  Allergies (verified): 1)  ! Penicillin 2)  ! Codeine 3)  ! Sulfa  Comments:  Nurse/Medical Assistant: patient didn't bring meds or list reviewed last ov and the only change is she is on glucosamine   Past History:  Past medical, surgical, family and social histories (including risk factors) reviewed, and no changes noted (except as noted below).  Past Medical History: Reviewed history from 01/01/2010 and no changes required. FIBRILLATION, ATRIAL (ICD-427.31)-not documented DEGENERATIVE JOINT DISEASE (ICD-715.90) OBESITY (ICD-278.00) VERTIGO (ICD-780.4) SLEEP APNEA (ICD-780.57) HYPERTENSION (ICD-401.9) FIBROMYALGIA (ICD-729.1) PUD (ICD-533.90) HYPERPLASTIC COLON POLYPS DIVERTICULOSIS Anxiety Disorder  Past Surgical History: Reviewed history from 08/21/2010 and no changes required. Appendectomy 1968 S/P BILROTH -I 1970S FOR PUD,HAD REVISION IN 2002 (JENKINS ,[place]) Cholecystectomy 1972 BTL TAH Cataract surgery Colonoscopy-planned for 08/2010  Family History: Reviewed history from 08/21/2010 and no changes required. Father emphysema,:deceased  Mother:alive and well, asthma Siblings:2 brothers alive and well 2 sisters 1 has emphyzemia 1 deceased due to car accident patient has 4 children alive and well Family History of Breast Cancer: Maternal Aunt  Stomach Cancer: MGM and Maternal Aunt  Family History of Diabetes: Father  Family History of Heart Disease: PGM No FH of Colon Cancer: Bone Cancer MGF  Social History: Reviewed history from 08/21/2010 and no changes required. Disabled, Retired  LPN Married  Tobacco Use - Never Alcohol Use - no Regular Exercise - no Drug Use - no Daily Caffeine Use: 3-4 weekly  4 children  Review of Systems       All other systems have been reviewed and are negative unless stated above.   Vital Signs:  Patient profile:   60 year old female Weight:      246 pounds BMI:     43.73 O2 Sat:      97 % on Room air Pulse rate:   75 / minute BP sitting:   130 / 77  (right arm)  Vitals Entered By: Dreama Saa, CNA (November 30, 2010 1:34 PM)  O2 Flow:  Room air  Physical Exam  General:  Well developed, well nourished, in no acute distress. Lungs:  Clear bilaterally to auscultation and percussion. Heart:  Non-displaced PMI, chest non-tender; regular rate and rhythm, S1, S2 without murmurs, rubs or gallops. Carotid upstroke normal, no bruit. Normal abdominal aortic size, no bruits. Femorals normal pulses, no bruits. Pedals normal pulses. No edema, no varicosities. Abdomen:  Bowel sounds positive; abdomen soft and non-tender without masses, organomegaly, or hernias noted. No hepatosplenomegaly. Msk:  Back normal, normal gait. Muscle strength and tone normal.joint tenderness right knee.   Pulses:  pulses normal in all 4 extremities Extremities:  No clubbing or cyanosis. Neurologic:  HOH otherwise normal Psych:  Normal affect.   EKG  Procedure date:  11/30/2010  Findings:      Normal sinus rhythm with rate of:  70 bpm  Impression & Recommendations:  Problem # 1:  FIBRILLATION, ATRIAL (ICD-427.31) Per Dr. Langston Masker note, after evaluation with echo and EKG patient can be taken off of coumadin. These records have been reviewed. Echo demonstrated EF of 55=60%;Right atrium was normal in size, Pulmonary artery was normal sized with normal range. Left atrium normal size.  EKG shows normal sinus rhythm.  We will take her off of coumadin.  She is to see [place] in 3 months for further evaluation.   The following medications were removed from the  medication list:    Coumadin 5 Mg Tabs (Warfarin sodium) .Marland Kitchen... Take as directed by coumadin clinic  Problem # 2:  HYPERTENSION (ICD-401.9) Well controlled on current medications.  No changes at this time. Her updated medication list for this problem includes:    Benicar 40 Mg Tabs (Olmesartan medoxomil) .Marland Kitchen... Take 1/2  tab daily    Chlorthalidone 25 Mg Tabs (Chlorthalidone) .Marland Kitchen... Take 1 tab daily  Problem # 3:  DYSPNEA (ICD-786.05) Assessment: Improved  Her updated medication list for this problem includes:    Benicar 40 Mg Tabs (Olmesartan medoxomil) .Marland Kitchen... Take 1/2  tab daily    Chlorthalidone 25 Mg Tabs (Chlorthalidone) .Marland Kitchen... Take 1 tab daily  Problem # 4:  HYPERLIPIDEMIA (ICD-272.4) Followed by Dr. Phillips Odor.  Patient Instructions: 1)  Your physician recommends that you schedule a follow-up appointment in: 3 months 2)  Your physician has recommended you make the following change in your medication: stop warfarin

## 2011-01-24 NOTE — Miscellaneous (Signed)
Summary: LABS CBCD,CMP,LIPIDS,09/22/2009  Clinical Lists Changes  Observations: Added new observation of CALCIUM: 8.7 mg/dL (16/09/9603 54:09) Added new observation of ALBUMIN: 3.8 g/dL (81/19/1478 29:56) Added new observation of PROTEIN, TOT: 6.6 g/dL (21/30/8657 84:69) Added new observation of SGPT (ALT): 13 units/L (09/22/2009 11:02) Added new observation of SGOT (AST): 21 units/L (09/22/2009 11:02) Added new observation of ALK PHOS: 89 units/L (09/22/2009 11:02) Added new observation of CREATININE: 1.45 mg/dL (62/95/2841 32:44) Added new observation of BUN: 32 mg/dL (12/25/7251 66:44) Added new observation of BG RANDOM: 90 mg/dL (03/47/4259 56:38) Added new observation of CO2 PLSM/SER: 22 meq/L (09/22/2009 11:02) Added new observation of CL SERUM: 105 meq/L (09/22/2009 11:02) Added new observation of K SERUM: 4.4 meq/L (09/22/2009 11:02) Added new observation of NA: 141 meq/L (09/22/2009 11:02) Added new observation of LDL: 121 mg/dL (75/64/3329 51:88) Added new observation of HDL: 29 mg/dL (41/66/0630 16:01) Added new observation of TRIGLYC TOT: 298 mg/dL (09/32/3557 32:20) Added new observation of CHOLESTEROL: 210 mg/dL (25/42/7062 37:62) Added new observation of PLATELETK/UL: 322 K/uL (09/22/2009 11:02) Added new observation of MCV: 95.4 fL (09/22/2009 11:02) Added new observation of HCT: 41.2 % (09/22/2009 11:02) Added new observation of HGB: 13.5 g/dL (83/15/1761 60:73) Added new observation of WBC COUNT: 8.4 10*3/microliter (09/22/2009 11:02)

## 2011-01-24 NOTE — Miscellaneous (Signed)
Summary: PFTs   Pulmonary Function Test Date: 09/13/2010 Height (in.): 64 Gender: Female  Pre-Spirometry FVC    Value: 2.62 L/min   Pred: 3.09 L/min     % Pred: 85 % FEV1    Value: 2.14 L     Pred: 2.29 L     % Pred: 93 % FEV1/FVC  Value: 82 %     Pred: 74 %    FEF 25-75  Value: 2.45 L/min   Pred: 2.62 L/min     % Pred: 93 %  Post-Spirometry FVC    Value: 2.51 L/min   Pred: 3.09 L/min     % Pred: 81 % FEV1    Value: 2.14 L     Pred: 2.29 L     % Pred: 93 % FEV1/FVC  Value: 85 %     Pred: 74 %    FEF 25-75  Value: 2.86 L/min   Pred: 2.62 L/min     % Pred: 109 %  Lung Volumes TLC    Value: 4.25 L   % Pred: 87 % RV    Value: 1.45 L   % Pred: 79 % DLCO    Value: 18.0 %   % Pred: 65 % DLCO/VA  Value: 4.80 %   % Pred: 127 %  Comments: mild reduction dlco.  normal spirometry and lung volumes  Clinical Lists Changes  Observations: Added new observation of PFT COMMENTS: mild reduction dlco.  normal spirometry and lung volumes (09/14/2010 9:43) Added new observation of DLCO/VA%EXP: 127 % (09/14/2010 9:43) Added new observation of DLCO/VA: 4.80 % (09/14/2010 9:43) Added new observation of DLCO % EXPEC: 65 % (09/14/2010 9:43) Added new observation of DLCO: 18.0 % (09/14/2010 9:43) Added new observation of RV % EXPECT: 79 % (09/14/2010 9:43) Added new observation of RV: 1.45 L (09/14/2010 9:43) Added new observation of TLC % EXPECT: 87 % (09/14/2010 9:43) Added new observation of TLC: 4.25 L (09/14/2010 9:43) Added new observation of FEF2575%EXPS: 109 % (09/14/2010 9:43) Added new observation of PSTFEF25/75P: 2.62  (09/14/2010 9:43) Added new observation of PSTFEF25/75%: 2.86 L/min (09/14/2010 9:43) Added new observation of FEV1FVCPRDPS: 74 % (09/14/2010 9:43) Added new observation of PSTFEV1/FVC: 85 % (09/14/2010 9:43) Added new observation of POSTFEV1%PRD: 93 % (09/14/2010 9:43) Added new observation of FEV1PRDPST: 2.29 L (09/14/2010 9:43) Added new observation of POST FEV1: 2.14  L/min (09/14/2010 9:43) Added new observation of POST FVC%EXP: 81 % (09/14/2010 9:43) Added new observation of FVCPRDPST: 3.09 L/min (09/14/2010 9:43) Added new observation of POST FVC: 2.51 L (09/14/2010 9:43) Added new observation of FEF % EXPEC: 93 % (09/14/2010 9:43) Added new observation of FEF25-75%PRE: 2.62 L/min (09/14/2010 9:43) Added new observation of FEF 25-75%: 2.45 L/min (09/14/2010 9:43) Added new observation of FEV1/FVC PRE: 74 % (09/14/2010 9:43) Added new observation of FEV1/FVC: 82 % (09/14/2010 9:43) Added new observation of FEV1 % EXP: 93 % (09/14/2010 9:43) Added new observation of FEV1 PREDICT: 2.29 L (09/14/2010 9:43) Added new observation of FEV1: 2.14 L (09/14/2010 9:43) Added new observation of FVC % EXPECT: 85 % (09/14/2010 9:43) Added new observation of FVC PREDICT: 3.09 L (09/14/2010 9:43) Added new observation of FVC: 2.62 L (09/14/2010 9:43) Added new observation of PFT HEIGHT: 64  (09/14/2010 9:43) Added new observation of PFT DATE: 09/13/2010  (09/14/2010 9:43)  Appended Document: PFTs result noted  patient aware

## 2011-01-24 NOTE — Procedures (Signed)
Summary: Colonoscopy:    Colonoscopy  Procedure date:  09/17/2001  Findings:      Pathology:  Hyperplastic polyp.     Location:  [facility].   Patient Name: [name], [name] MRN:  Procedure Procedures: Colonoscopy CPT: 56213.    with biopsy. CPT: Q5068410.    with Hot Biopsy(s)CPT: Z451292.    with polypectomy. CPT: A3573898.  Personnel: Endoscopist: Ulyess Mort, MD.  Referred By: Oneal Deputy. Juanetta Gosling, MD.  Exam Location: Exam performed in Outpatient Clinic. Outpatient  Patient Consent: Procedure, Alternatives, Risks and Benefits discussed, consent obtained, from patient.  Indications Symptoms: Abdominal pain / bloating. Change in bowel habits.  History  Pre-Exam Physical: Performed Sep 17, 2001. Cardio-pulmonary exam, Rectal exam, HEENT exam , Abdominal exam, Extremity exam, Mental status exam WNL.  Exam Exam: Extent of exam reached: Ileum, extent intended: Cecum.  The cecum was identified by appendiceal orifice and IC valve. Patient position: left side to back. Colon retroflexion performed. Images were not taken. ASA Classification: II. Tolerance: good.  Monitoring: Pulse and BP monitoring, Oximetry used. Supplemental O2 given.  Colon Prep Prep results: good.  Sedation Meds: Patient assessed and found to be appropriate for moderate (conscious) sedation. Fentanyl 75 mcg. Versed 10 mg.  Findings POLYP: Transverse Colon, Maximum size: 3 mm. sessile polyp. Procedure:  hot biopsy,  POLYP: Ascending Colon, Maximum size: 8 mm. sessile polyp. Procedure:  snare with cautery, removed, retrieved, Polyp sent to pathology. ICD9: Colon Polyps: 211.3.  OTHER FINDING: Ascending Colon. Comments: seems to have decreased peristalsis throughout colon.  - DIVERTICULOSIS: Cecum to Sigmoid Colon. Comments: mild diffuse diverticulosis.  MULTIPLE POLYPS: Sigmoid Colon. minimum size 4 mm, maximum size 7 mm. Procedure:  hot biopsy, removed, retrieved, Polyps sent to  pathology.   Assessment Abnormal examination, see findings above.  Diagnoses: 211.3: Colon Polyps.   Events  Unplanned Interventions: No intervention was required.  Unplanned Events: There were no complications. Plans Medication Plan: Continue current medications.  Patient Education: Patient given standard instructions for: Polyps. Diverticulosis. Yearly hemoccult testing recommended. Patient instructed to get routine colonoscopy every 3 years. will try zelnorm to see if wil help increase peristalsis .  Disposition: After procedure patient sent to recovery. After recovery patient sent home.  Scheduling/Referral: Clinic Visit, to Ulyess Mort, MD, around Oct 14, 2001.   CC:   Kari Baars, MD    This report was created from the original endoscopy report, which was reviewed and signed by the above listed endoscopist.

## 2011-01-24 NOTE — Miscellaneous (Signed)
Summary: LABSCMP,CBCD,LIPIDS,TSH,02/12/2010  Clinical Lists Changes  Observations: Added new observation of CALCIUM: 8.5 mg/dL (04/54/0981 19:14) Added new observation of ALBUMIN: 4.1 g/dL (78/29/5621 30:86) Added new observation of PROTEIN, TOT: 6.8 g/dL (57/84/6962 95:28) Added new observation of SGPT (ALT): 10 units/L (02/12/2010 10:31) Added new observation of SGOT (AST): 14 units/L (02/12/2010 10:31) Added new observation of ALK PHOS: 80 units/L (02/12/2010 10:31) Added new observation of GFR AA: 50 mL/min/1.21m2 (02/12/2010 10:31) Added new observation of GFR: 42 mL/min (02/12/2010 10:31) Added new observation of CREATININE: 1.31 mg/dL (41/32/4401 02:72) Added new observation of BUN: 24 mg/dL (53/66/4403 47:42) Added new observation of BG RANDOM: 96 mg/dL (59/56/3875 64:33) Added new observation of CO2 PLSM/SER: 23 meq/L (02/12/2010 10:31) Added new observation of CL SERUM: 100 meq/L (02/12/2010 10:31) Added new observation of K SERUM: 4.2 meq/L (02/12/2010 10:31) Added new observation of NA: 139 meq/L (02/12/2010 10:31) Added new observation of LDL: 134 mg/dL (29/51/8841 66:06) Added new observation of HDL: 35 mg/dL (30/16/0109 32:35) Added new observation of TRIGLYC TOT: 213 mg/dL (57/32/2025 42:70) Added new observation of CHOLESTEROL: 212 mg/dL (62/37/6283 15:17) Added new observation of PLATELETK/UL: 305 K/uL (02/12/2010 10:31) Added new observation of MCV: 95.3 fL (02/12/2010 10:31) Added new observation of HCT: 44.8 % (02/12/2010 10:31) Added new observation of HGB: 14.0 g/dL (61/60/7371 06:26) Added new observation of WBC COUNT: 8.0 10*3/microliter (02/12/2010 10:31) Added new observation of TSH: 2.251 microintl units/mL (02/12/2010 10:31)

## 2011-01-24 NOTE — Assessment & Plan Note (Signed)
Summary: SIX MIN WALK- PULM STRESS TEST   Nurse Visit   Vital Signs:  Patient profile:   60 year old female Pulse rate:   74 / minute BP sitting:   116 / 88  Medications Prior to Update: 1)  Benicar 40 Mg Tabs (Olmesartan Medoxomil) .... Take 1/2  Tab Daily 2)  Prozac 40 Mg Caps (Fluoxetine Hcl) .... Take 1 Tab Daily 3)  Fentanyl 50 Mcg/hr Pt72 (Fentanyl) .... Q72 Hrs 4)  Tizanidine Hcl 2 Mg Tabs (Tizanidine Hcl) .... Take 2 Tabs Daily 5)  Alprazolam 0.5 Mg Tabs (Alprazolam) .... Take As Needed 6)  Reglan 5 Mg Tabs (Metoclopramide Hcl) .... Take As Needed 7)  Chlorthalidone 25 Mg Tabs (Chlorthalidone) .... Take 1 Tab Daily 8)  Coumadin 5 Mg Tabs (Warfarin Sodium) .... Take As Directed By Coumadin Clinic 9)  Celebrex 200 Mg Caps (Celecoxib) .... Take 1 Capsule By Mouth Two Times A Day 10)  Glycopyrrolate 2 Mg Tabs (Glycopyrrolate) .... Take As Needed 11)  Hydrocodone-Acetaminophen 5-500 Mg Tabs (Hydrocodone-Acetaminophen) .... Every Six Hours As Needed 12)  Nexium 40 Mg Cpdr (Esomeprazole Magnesium) .... Take 1 Tab Daily 13)  Omega-3 Krill Oil 300 Mg Caps (Krill Oil) .... Take 1 Tab Daily 14)  Calcium-Vitamin D 600-200 Mg-Unit Tabs (Calcium-Vitamin D) .... Take 1 Tablet By Mouth Once A Day 15)  Co Q-10 150 Mg Caps (Coenzyme Q10) .... Take 1 Tab Daily 16)  Vitamin B-12 1000 Mcg Tabs (Cyanocobalamin) .... Take 1 Tab Daily 17)  Nulev 0.125 Mg Tbdp (Hyoscyamine Sulfate) .... Take As Needed 18)  Benadryl 25 Mg Tabs (Diphenhydramine Hcl) .... Take As Needed 19)  Robinul-Forte 2 Mg Tabs (Glycopyrrolate) .... Take As Needed  Allergies: 1)  ! Penicillin 2)  ! Codeine  Orders Added: 1)  Pulmonary Stress (6 min walk) [94620]   Six Minute Walk Test Medications taken before test(dose and time): none taken today per pt Supplemental oxygen during the test: No  Lap counter(place a tick mark inside a square for each lap completed) lap 1 complete  lap 2 complete   lap 3 complete   lap 4  complete  lap 5 complete  lap 6 complete   Baseline  BP sitting: 116/ 88 Heart rate: 74 Dyspnea ( Borg scale) 0 Fatigue (Borg scale) 1 SPO2 96  End Of Test  BP sitting: 132/ 86 Heart rate: 101 Dyspnea ( Borg scale) 4 Fatigue (Borg scale) 3 SPO2 96  2 Minutes post  BP sitting: 126/ 80 Heart rate: 70 SPO2 98  Stopped or paused before six minutes? No  Interpretation: Number of laps  6 X 48 meters =   288 meters =    288 meters   Total distance walked in six minutes: 288 meters  Tech ID: Tivis Ringer, CNA (September 14, 2010 10:40 AM) Jeremy Johann Comments pt completed test w/0 rest breaks and 1 complaint: lower back pain. Jerolyn Shin conducted this test and I have documented this for her.

## 2011-01-24 NOTE — Miscellaneous (Signed)
Summary: Orders Update pft charges  Clinical Lists Changes  Orders: Added new Service order of Carbon Monoxide diffusing w/capacity (94720) - Signed Added new Service order of Lung Volumes (94240) - Signed Added new Service order of Spirometry (Pre & Post) (94060) - Signed 

## 2011-01-24 NOTE — Progress Notes (Signed)
Summary: nos appt  Phone Note Call from Patient   Caller: [email or web address]  Call For: [name] Summary of Call: In ref to 10/5 nos, pt states she will call tomorrow to rsc. Initial call taken by: Darletta Moll,  September 27, 2010 3:24 PM     Appended Document: Pulmonary OV fax Assunta Found

## 2011-01-24 NOTE — Miscellaneous (Signed)
Summary: DR.GESSNER LABS CBCD 01/01/2010  Clinical Lists Changes  Observations: Added new observation of PLATELETK/UL: 340 K/uL (01/01/2010 11:06) Added new observation of MCV: 94.3 fL (01/01/2010 11:06) Added new observation of HCT: 42.9 % (01/01/2010 11:06) Added new observation of HGB: 14.2 g/dL (10/19/2535 64:40) Added new observation of WBC COUNT: 8.9 10*3/microliter (01/01/2010 11:06)

## 2011-01-24 NOTE — Letter (Signed)
Summary: [place] Results Engineer, agricultural at [facility]  [address] [place] [address]   Phone: [phone]  Fax: [phone]      August 16, 2010 MRN: [id number]   [facility] [place] [address] [place], [place]  [address]   Dear Ms. SANZONE,  Your test ordered by Selena Batten has been reviewed by your physician (or physician assistant) and was found to be normal or stable. Your physician (or physician assistant) felt no changes were needed at this time.  __X__ Echocardiogram  ____ Cardiac Stress Test  __X__ Lab Work  ____ Peripheral vascular study of arms, legs or neck  __X__ X-ray  ____ Lung or Breathing test  ____ Other: Please continue current medical reatment.  Thank you.   [place] Bing, MD, F.A.C.C

## 2011-01-24 NOTE — Assessment & Plan Note (Signed)
Summary: Pulmonary OV   Copy to:  Gerrit Friends. Dietrich Pates, MD  Primary Provider/Referring Provider:  Assunta Found, MD   CC:  Followup dyspnea.  Pt states that her breathing is unchanged since last seen.  She would like to review results of PSG and PFT's.  No new complaints today.Marland Kitchen  History of Present Illness: Pulmonary OV  60 yo WF referred for eval of ?pulm HTN and sleep apnea. Pt was dx with pulm HTN due to sleep apnea early 2000's  Pt weighed 325#.  Pt then had sleep study and rx cpap @11cmh20 .  Now on sleep apnea mask.  Despite this pt is getting more dyspnea and fatigue.  Notes pain in upper chest .  Notes more spells of bronchitis in summer.  Weight is down to 261.  Not as much selling with diuretic.  Pt with PUD.  and had gastrectomy in the past. No prior dx of AMI or CAD.  Dx HTN and CVA in past.  The chest hurts and has a dull ache.  Pt is on glycopyrrolate for bowels.    Pt here for eval of pulm HTN, but note that recent echo did not show any pulm HTN> November 27, 2010 10:20 AM The pts dyspnea is the same.  There is not much edema.  The pt  travels alot and legs will swell L ankle swells more that the other.  Pain only with extreme exertion.  No real wheeze. PFTs were normal except mild dlco reduction corrects with [facility]. The pt now has cpap now at 7cmh20.   No daytime hypersomnolence is noted. Pt denies any significant sore throat, nasal congestion or excess secretions, fever, chills, sweats, unintended weight loss, pleurtic or exertional chest pain, orthopnea PND, or leg swelling Pt denies any increase in rescue therapy over baseline, denies waking up needing it or having any early am or nocturnal exacerbations of coughing/wheezing/or dyspnea.  The pt continues to lose weight successfully   Preventive Screening-Counseling & Management  Alcohol-Tobacco     Smoking Status: never  Current Medications (verified): 1)  Benicar 40 Mg Tabs (Olmesartan Medoxomil) .... Take 1/2  Tab  Daily 2)  Prozac 40 Mg Caps (Fluoxetine Hcl) .... Take 1 Tab Daily 3)  Fentanyl 50 Mcg/hr 61 (Fentanyl) .Marland Kitchen.. 1 Every 3 Days 4)  Tizanidine Hcl 2 Mg Tabs (Tizanidine Hcl) .... Take 2 Tabs Daily 5)  Alprazolam 0.5 Mg Tabs (Alprazolam) .... Take As Needed 6)  Reglan 5 Mg Tabs (Metoclopramide Hcl) .... Take As Needed 7)  Chlorthalidone 25 Mg Tabs (Chlorthalidone) .... Take 1 Tab Daily 8)  Coumadin 5 Mg Tabs (Warfarin Sodium) .... Take As Directed By Coumadin Clinic 9)  Celebrex 200 Mg Caps (Celecoxib) .... Take 1 Capsule By Mouth Two Times A Day 10)  Glycopyrrolate 2 Mg Tabs (Glycopyrrolate) .... Take As Needed 11)  Hydrocodone-Acetaminophen 5-500 Mg Tabs (Hydrocodone-Acetaminophen) .... Every Six Hours As Needed 12)  Prilosec 40 Mg Cpdr (Omeprazole) .Marland Kitchen.. 1 At Bedtime 13)  Omega-3 Krill Oil 300 Mg Caps (Krill Oil) .... Take 1 Tab Daily 14)  Calcium-Vitamin D 600-200 Mg-Unit Tabs (Calcium-Vitamin D) .... Take 1 Tablet By Mouth Once A Day 15)  Co Q-10 150 Mg Caps (Coenzyme Q10) .... Take 1 Tab Daily 16)  Vitamin B-12 1000 Mcg Tabs (Cyanocobalamin) .... Take 1 Tab Daily 17)  Nulev 0.125 Mg Tbdp (Hyoscyamine Sulfate) .... Take As Needed 18)  Benadryl 25 Mg Tabs (Diphenhydramine Hcl) .... Take As Needed 19)  Robinul-Forte 2 Mg  Tabs (Glycopyrrolate) .... Take As Needed  Allergies: 1)  ! Penicillin 2)  ! Codeine 3)  ! Sulfa  Past History:  Past medical, surgical, family and social histories (including risk factors) reviewed, and no changes noted (except as noted below).  Past Medical History: Reviewed history from 01/01/2010 and no changes required. FIBRILLATION, ATRIAL (ICD-427.31)-not documented DEGENERATIVE JOINT DISEASE (ICD-715.90) OBESITY (ICD-278.00) VERTIGO (ICD-780.4) SLEEP APNEA (ICD-780.57) HYPERTENSION (ICD-401.9) FIBROMYALGIA (ICD-729.1) PUD (ICD-533.90) HYPERPLASTIC COLON POLYPS DIVERTICULOSIS Anxiety Disorder  Past Surgical History: Reviewed history from 08/21/2010  and no changes required. Appendectomy 1968 S/P BILROTH -I 1970S FOR PUD,HAD REVISION IN 2002 ([facility] ,[place]) Cholecystectomy 1972 BTL TAH Cataract surgery Colonoscopy-planned for 08/2010  Family History: Reviewed history from 08/21/2010 and no changes required. Father emphysema,:deceased  Mother:alive and well, asthma Siblings:2 brothers alive and well 2 sisters 1 has emphyzemia 1 deceased due to car accident patient has 4 children alive and well Family History of Breast Cancer: Maternal Aunt  Stomach Cancer: MGM and Maternal Aunt  Family History of Diabetes: Father  Family History of Heart Disease: PGM No FH of Colon Cancer: Bone Cancer MGF  Social History: Reviewed history from 08/21/2010 and no changes required. Disabled, Retired LPN Married  Tobacco Use - Never Alcohol Use - no Regular Exercise - no Drug Use - no Daily Caffeine Use: 3-4 weekly  4 children  Review of Systems       The patient complains of shortness of breath with activity.  The patient denies shortness of breath at rest, productive cough, non-productive cough, coughing up blood, chest pain, irregular heartbeats, acid heartburn, indigestion, loss of appetite, weight change, abdominal pain, difficulty swallowing, sore throat, tooth/dental problems, headaches, nasal congestion/difficulty breathing through nose, sneezing, itching, ear ache, anxiety, depression, hand/feet swelling, joint stiffness or pain, rash, change in color of mucus, and fever.    Vital Signs:  Patient profile:   60 year old female Weight:      248.50 pounds BMI:     44.18 O2 Sat:      92 % on Room air Temp:     98.1 degrees F oral Pulse rate:   66 / minute BP sitting:   114 / 70  (left arm) Cuff size:   large  Vitals Entered By: Vernie Murders (November 27, 2010 10:04 AM)  O2 Flow:  Room air  Physical Exam  Additional Exam:  Gen:  obese iin no distress,  normal affect ENT: No lesions,  mouth clear,  oropharynx clear, no  postnasal drip Neck: No JVD, no TMG, no carotid bruits Lungs: No use of accessory muscles, no dullness to percussion, clear without rales or rhonchi Cardiovascular: RRR, heart sounds normal, no murmur or gallops, no peripheral edema Abdomen: soft and NT, no HSM,  BS normal Musculoskeletal: No deformities, no cyanosis or clubbing Neuro: alert, non focal Skin: Warm, no lesions or rashes    Impression & Recommendations:  Problem # 1:  SLEEP APNEA (ICD-780.57) Assessment Improved stable osa, no pulm htn, no other primary lung disease plan weight loss to continue cpap at bedtime  Orders: Est. Patient Level III (19147)  Medications Added to Medication List This Visit: 1)  Fentanyl 50 Mcg/hr 61 (Fentanyl) .Marland Kitchen.. 1 every 3 days 2)  Prilosec 40 Mg Cpdr (Omeprazole) .Marland Kitchen.. 1 at bedtime  Complete Medication List: 1)  Benicar 40 Mg Tabs (Olmesartan medoxomil) .... Take 1/2  tab daily 2)  Prozac 40 Mg Caps (Fluoxetine hcl) .... Take 1 tab daily 3)  Fentanyl 50  Mcg/hr 61 (Fentanyl) .Marland Kitchen.. 1 every 3 days 4)  Tizanidine Hcl 2 Mg Tabs (Tizanidine hcl) .... Take 2 tabs daily 5)  Alprazolam 0.5 Mg Tabs (Alprazolam) .... Take as needed 6)  Reglan 5 Mg Tabs (Metoclopramide hcl) .... Take as needed 7)  Chlorthalidone 25 Mg Tabs (Chlorthalidone) .... Take 1 tab daily 8)  Coumadin 5 Mg Tabs (Warfarin sodium) .... Take as directed by coumadin clinic 9)  Celebrex 200 Mg Caps (Celecoxib) .... Take 1 capsule by mouth two times a day 10)  Glycopyrrolate 2 Mg Tabs (Glycopyrrolate) .... Take as needed 11)  Hydrocodone-acetaminophen 5-500 Mg Tabs (Hydrocodone-acetaminophen) .... Every six hours as needed 12)  Prilosec 40 Mg Cpdr (Omeprazole) .Marland Kitchen.. 1 at bedtime 13)  Omega-3 Krill Oil 300 Mg Caps (Krill oil) .... Take 1 tab daily 14)  Calcium-vitamin D 600-200 Mg-unit Tabs (Calcium-vitamin d) .... Take 1 tablet by mouth once a day 15)  Co Q-10 150 Mg Caps (Coenzyme q10) .... Take 1 tab daily 16)  Vitamin B-12  1000 Mcg Tabs (Cyanocobalamin) .... Take 1 tab daily 17)  Nulev 0.125 Mg Tbdp (Hyoscyamine sulfate) .... Take as needed 18)  Benadryl 25 Mg Tabs (Diphenhydramine hcl) .... Take as needed 19)  Robinul-forte 2 Mg Tabs (Glycopyrrolate) .... Take as needed  Patient Instructions: 1)  Return one year for sleep apnea check 2)  Continue to focus on weight loss.  Appended Document: Pulmonary OV fax Assunta Found

## 2011-01-24 NOTE — Assessment & Plan Note (Signed)
Summary: f11m  Medications Added BENICAR 40 MG TABS (OLMESARTAN MEDOXOMIL) take 1/2  tab daily NEXIUM 40 MG CPDR (ESOMEPRAZOLE MAGNESIUM) take 1 tab daily OMEGA-3 KRILL OIL 300 MG CAPS (KRILL OIL) take 1 tab daily OSCAL 500/200 D-3 500-200 MG-UNIT TABS (CALCIUM-VITAMIN D) take 1 tab daily CO Q-10 150 MG CAPS (COENZYME Q10) take 1 tab daily VITAMIN B-12 1000 MCG TABS (CYANOCOBALAMIN) take 1 tab daily NULEV 0.125 MG TBDP (HYOSCYAMINE SULFATE) take as needed BENADRYL 25 MG TABS (DIPHENHYDRAMINE HCL) take as needed ROBINUL-FORTE 2 MG TABS (GLYCOPYRROLATE) take as needed      Allergies Added:   Visit Type:  Follow-up Referring Provider:  Gerrit Friends. Dietrich Pates, MD  Primary Provider:  Assunta Found, MD    History of Present Illness: Ms. Zoriah Pulice returns to the office for continued assessment and treatment of a history of atrial arrhythmias, pulmonary hypertension, fibromyalgia, sleep apnea and hypertension.  Since her last visit, she has done fairly well.  She notes some episodes of palpitations and diaphoresis at night.  She has experienced increasing dyspnea on exertion and a modest decline in her exercise capacity.  She experiences a constant aching across the anterior chest that is more annoying than painful.  She uses Celebrex with some relief of her discomfort.  Current Medications (verified): 1)  Benicar 40 Mg Tabs (Olmesartan Medoxomil) .... Take 1/2  Tab Daily 2)  Prozac 40 Mg Caps (Fluoxetine Hcl) .... Take 1 Tab Daily 3)  Fentanyl 50 Mcg/hr Pt72 (Fentanyl) .... Q72 Hrs 4)  Tizanidine Hcl 2 Mg Tabs (Tizanidine Hcl) .... Take 2 Tabs Daily 5)  Alprazolam 0.5 Mg Tabs (Alprazolam) .... Take As Needed 6)  Reglan 5 Mg Tabs (Metoclopramide Hcl) .... Take As Needed 7)  Chlorthalidone 25 Mg Tabs (Chlorthalidone) .... Take 1 Tab Daily 8)  Warfarin Sodium 4 Mg Tabs (Warfarin Sodium) .... Take As Directed By [facility] Clinic 9)  Celebrex 100 Mg Caps (Celecoxib) .... Take 1 Tab Two Times A  Day 10)  Glycopyrrolate 2 Mg Tabs (Glycopyrrolate) .... Take As Needed 11)  Hydrocodone-Acetaminophen 5-500 Mg Tabs (Hydrocodone-Acetaminophen) .... Every Eight Hours As Needed 12)  Nexium 40 Mg Cpdr (Esomeprazole Magnesium) .... Take 1 Tab Daily 13)  Omega-3 Krill Oil 300 Mg Caps (Krill Oil) .... Take 1 Tab Daily 14)  Oscal 500/200 D-3 500-200 Mg-Unit Tabs (Calcium-Vitamin D) .... Take 1 Tab Daily 15)  Co Q-10 150 Mg Caps (Coenzyme Q10) .... Take 1 Tab Daily 16)  Vitamin B-12 1000 Mcg Tabs (Cyanocobalamin) .... Take 1 Tab Daily 17)  Nulev 0.125 Mg Tbdp (Hyoscyamine Sulfate) .... Take As Needed 18)  Benadryl 25 Mg Tabs (Diphenhydramine Hcl) .... Take As Needed 19)  Robinul-Forte 2 Mg Tabs (Glycopyrrolate) .... Take As Needed  Allergies (verified): 1)  ! Penicillin 2)  ! Codeine  Past History:  PMH, FH, and Social History reviewed and updated.  Past Surgical History: Appendectomy S/P BILROTH -I 1970S FOR PUD,HAD REVISION IN 2002 (JENKINS ,[place]) Cholecystectomy BTL TAH Cataract surgery Colonoscopy-planned for 08/2010  Review of Systems  The patient denies anorexia, fever, weight loss, weight gain, vision loss, decreased hearing, hoarseness, syncope, peripheral edema, prolonged cough, headaches, abdominal pain, and hematochezia.    Vital Signs:  Patient profile:   60 year old female Weight:      264 pounds O2 Sat:      98 % on Room air Pulse rate:   71 / minute BP sitting:   108 / 69  (right arm)  Vitals Entered  By: Dreama Saa, CNA (August 03, 2010 1:30 PM)  O2 Flow:  Room air  Physical Exam  General:  Obese; well developed; no acute distress:   Neck-No JVD; no carotid bruits: Lungs-No tachypnea, no rales; no rhonchi; no wheezes: Cardiovascular-normal PMI; normal S1 and S2; modest systolic ejection murmur at the cardiac base Abdomen-BS normal; soft and non-tender without masses or organomegaly:  Musculoskeletal-No deformities, no cyanosis or  clubbing: Neurologic-Normal cranial nerves; symmetric strength and tone:  Skin-Warm, no significant lesions: Extremities-Nl distal pulses; no edema:     Impression & Recommendations:  Problem # 1:  FIBRILLATION, ATRIAL (ICD-427.31) There continues to be no documentation and no recurrence of atrial arrhythmias.  Interestingly, both the tracing obtained today and an EKG performed last year demonstrate artifact simulating atrial flutter, likely a result of the patient's tremor.  Very possibly, she has never had an atrial arrhythmia.  Reassessment of her cardiopulmonary status will require a chest x-ray, echocardiogram and laboratory studies including a BNP level.  If nothing unexpected is revealed, anticoagulation with warfarin will be discontinued.  Problem # 2:  HYPERTENSIVE CARDIOVASCULAR DISEASE (ICD-402.90) Similarly, the patient's diagnosis of primary pulmonary hypertension is likely bogus.  This was applied to her in 2003 after cardiac catheterization revealed PA systolic pressures of 45-50.  While this entity is rapidly fatal in most patients, Ms. Fundora has improved over the intervening 8 years.  An echocardiogram a few years ago showed only a mild elevation of right-sided pressure.  Elevated pulmonary artery pressure was most likely secondary to decreased left ventricular compliance due to hypertensive heart disease and fluid overload with a high wedge pressure.  Problem # 3:  HYPERLIPIDEMIA (ICD-272.4) A repeat lipid profile is pending.  Patient has no significant atherosclerosis and likely does not require pharmacologic therapy.  Problem # 4:  DYSPNEA (ICD-786.05) Dyspnea is likely secondary to physical deconditioning and obesity, but appropriate cardiopulmonary causes will be excluded by testing.  Problem # 5:  HYPERTENSION (ICD-401.9) Blood pressure control has been excellent; current medications will be continued.  Patient was advised that when she experiences symptoms that she  attributes to fluid retention, she may take an extra chlorthalidone for a few days.  I will plan to reassess this nice woman in 2 months.  Other Orders: T-Comprehensive Metabolic Panel [phone]) T-Lipid Profile [phone]) T-CBC w/Diff [phone]) T-TSH [id number]) T-BNP  (B Natriuretic Peptide) [phone]) T-Chest x-ray, 2 views (02725) 2-D Echocardiogram (2D Echo)  EKG  Procedure date:  08/03/2010  Findings:      Normal sinus rhythm at a rate of 72 Baseline artifact simulating atrial flutter Nonspecific T wave abnormality Slightly delayed R-wave progression No change since previous tracing of 01/17/09   Patient Instructions: 1)  Your physician recommends that you schedule a follow-up appointment in: 2 months 2)  Your physician recommends that you return for lab work in: next week 3)  A chest x-ray takes a picture of the organs and structures inside the chest, including the heart, lungs, and blood vessels. This test can show several things, including, whether the heart is enlarged; whether fluid is building up in the lungs; and whether pacemaker / defibrillator leads are still in place. 4)  Your physician has requested that you have an echocardiogram.  Echocardiography is a painless test that uses sound waves to create images of your heart. It provides your doctor with information about the size and shape of your heart and how well your heart's chambers and valves are working.  This procedure takes  approximately one hour. There are no restrictions for this procedure.

## 2011-01-24 NOTE — Assessment & Plan Note (Signed)
Summary: Pulmonary Consultation   Copy to:  Gerrit Friends. Dietrich Pates, MD  Primary Chera Slivka/Referring Aishia Barkey:  Assunta Found, MD   CC:  Pulmonary Consult for SOB.  Former pt in 2003.Marland Kitchen  History of Present Illness: Pulmonary Consultation  [id number] referred for eval of ?pulm HTN and sleep apnea. Pt was dx with pulm HTN due to sleep apnea early 2000's  Pt weighed 325#.  Pt then had sleep study and rx cpap @11cmh20 .  Now on sleep apnea mask.  Despite this pt is getting more dyspnea and fatigue.  Notes pain in upper chest .  Notes more spells of bronchitis in summer.  Weight is down to 261.  Not as much selling with diuretic.  Pt with PUD.  and had gastrectomy in the past. No prior dx of AMI or CAD.  Dx HTN and CVA in past.  The chest hurts and has a dull ache.  Pt is on glycopyrrolate for bowels.    Pt here for eval of pulm HTN, but note that recent echo did not show any pulm HTN>    Preventive Screening-Counseling & Management  Alcohol-Tobacco     Smoking Status: never  Current Medications (verified): 1)  Benicar 40 Mg Tabs (Olmesartan Medoxomil) .... Take 1/2  Tab Daily 2)  Prozac 40 Mg Caps (Fluoxetine Hcl) .... Take 1 Tab Daily 3)  Fentanyl 50 Mcg/hr Pt72 (Fentanyl) .... Q72 Hrs 4)  Tizanidine Hcl 2 Mg Tabs (Tizanidine Hcl) .... Take 2 Tabs Daily 5)  Alprazolam 0.5 Mg Tabs (Alprazolam) .... Take As Needed 6)  Reglan 5 Mg Tabs (Metoclopramide Hcl) .... Take As Needed 7)  Chlorthalidone 25 Mg Tabs (Chlorthalidone) .... Take 1 Tab Daily 8)  Coumadin 5 Mg Tabs (Warfarin Sodium) .... Take As Directed By [facility]madin Clinic 9)  Celebrex 200 Mg Caps (Celecoxib) .... Take 1 Capsule By Mouth Two Times A Day 10)  Glycopyrrolate 2 Mg Tabs (Glycopyrrolate) .... Take As Needed 11)  Hydrocodone-Acetaminophen 5-500 Mg Tabs (Hydrocodone-Acetaminophen) .... Every Six Hours As Needed 12)  Nexium 40 Mg Cpdr (Esomeprazole Magnesium) .... Take 1 Tab Daily 13)  Omega-3 Krill Oil 300 Mg Caps (Krill Oil) .... Take  1 Tab Daily 14)  Calcium-Vitamin D 600-200 Mg-Unit Tabs (Calcium-Vitamin D) .... Take 1 Tablet By Mouth Once A Day 15)  Co Q-10 150 Mg Caps (Coenzyme Q10) .... Take 1 Tab Daily 16)  Vitamin B-12 1000 Mcg Tabs (Cyanocobalamin) .... Take 1 Tab Daily 17)  Nulev 0.125 Mg Tbdp (Hyoscyamine Sulfate) .... Take As Needed 18)  Benadryl 25 Mg Tabs (Diphenhydramine Hcl) .... Take As Needed 19)  Robinul-Forte 2 Mg Tabs (Glycopyrrolate) .... Take As Needed  Allergies (verified): 1)  ! Penicillin 2)  ! Codeine  Past History:  Past medical, surgical, family and social histories (including risk factors) reviewed, and no changes noted (except as noted below).  Past Medical History: Reviewed history from 01/01/2010 and no changes required. FIBRILLATION, ATRIAL (ICD-427.31)-not documented DEGENERATIVE JOINT DISEASE (ICD-715.90) OBESITY (ICD-278.00) VERTIGO (ICD-780.4) SLEEP APNEA (ICD-780.57) HYPERTENSION (ICD-401.9) FIBROMYALGIA (ICD-729.1) PUD (ICD-533.90) HYPERPLASTIC COLON POLYPS DIVERTICULOSIS Anxiety Disorder  Past Surgical History: Appendectomy 1968 S/P BILROTH -I 1970S FOR PUD,HAD REVISION IN 2002 (JENKINS ,[place]) Cholecystectomy 1972 BTL TAH Cataract surgery Colonoscopy-planned for 08/2010  Family History: Reviewed history from 01/01/2010 and no changes required. Father emphysema,:deceased  Mother:alive and well, asthma Siblings:2 brothers alive and well 2 sisters 1 has emphyzemia 1 deceased due to car accident patient has 4 children alive and well Family History of Breast Cancer: Maternal  Aunt  Stomach Cancer: MGM and Maternal Aunt  Family History of Diabetes: Father  Family History of Heart Disease: PGM No FH of Colon Cancer: Bone Cancer MGF  Social History: Reviewed history from 01/01/2010 and no changes required. Disabled, Retired LPN Married  Tobacco Use - Never Alcohol Use - no Regular Exercise - no Drug Use - no Daily Caffeine Use: 3-4 weekly  4  children  Review of Systems       The patient complains of shortness of breath with activity, non-productive cough, chest pain, irregular heartbeats, ear ache, hand/feet swelling, and joint stiffness or pain.  The patient denies shortness of breath at rest, productive cough, coughing up blood, acid heartburn, indigestion, loss of appetite, weight change, abdominal pain, difficulty swallowing, sore throat, tooth/dental problems, headaches, nasal congestion/difficulty breathing through nose, sneezing, itching, anxiety, depression, rash, change in color of mucus, and fever.    Vital Signs:  Patient profile:   60 year old female Height:      63 inches Weight:      264 pounds O2 Sat:      96 % on Room air Temp:     98.2 degrees F oral Pulse rate:   70 / minute BP sitting:   100 / 60  (left arm) Cuff size:   large  Vitals Entered By: Gweneth Dimitri RN (August 21, 2010 8:45 AM)  O2 Flow:  Room air CC: Pulmonary Consult for SOB.  Former pt in 2003. Comments Medications reviewed with patient Daytime contact number verified with patient. Gweneth Dimitri RN  August 21, 2010 8:54 AM    Physical Exam  Additional Exam:  Gen: Pleasant, obese iin no distress,  normal affect ENT: No lesions,  mouth clear,  oropharynx clear, no postnasal drip Neck: No JVD, no TMG, no carotid bruits Lungs: No use of accessory muscles, no dullness to percussion, clear without rales or rhonchi Cardiovascular: RRR, heart sounds normal, no murmur or gallops, no peripheral edema Abdomen: soft and NT, no HSM,  BS normal Musculoskeletal: No deformities, no cyanosis or clubbing Neuro: alert, non focal Skin: Warm, no lesions or rashes    Impression & Recommendations:  Problem # 1:  SLEEP APNEA (ICD-780.57) This pt may have poorly controlled sleep apnea.  There is no pulm HTN on recent echo.  Dyspnea likely related to sleep apnea.  Pt is actually c/o more of fatigue than dyspnea.  However a full PFT should be  obtained plan full pfts sleep study with cpap titration  Orders: New Patient Level V (32440) Sleep Disorder Referral (Sleep Disorder)  Problem # 2:  DYSPNEA (ICD-786.05) Assessment: Unchanged see assessment number one will obtain pft  Orders: New Patient Level V (10272) Pulmonary Referral (Pulmonary)  Medications Added to Medication List This Visit: 1)  Coumadin 5 Mg Tabs (Warfarin sodium) .... Take as directed by coumadin clinic 2)  Celebrex 200 Mg Caps (Celecoxib) .... Take 1 capsule by mouth two times a day 3)  Hydrocodone-acetaminophen 5-500 Mg Tabs (Hydrocodone-acetaminophen) .... Every six hours as needed 4)  Calcium-vitamin D 600-200 Mg-unit Tabs (Calcium-vitamin d) .... Take 1 tablet by mouth once a day  Complete Medication List: 1)  Benicar 40 Mg Tabs (Olmesartan medoxomil) .... Take 1/2  tab daily 2)  Prozac 40 Mg Caps (Fluoxetine hcl) .... Take 1 tab daily 3)  Fentanyl 50 Mcg/hr Pt72 (Fentanyl) .... Q72 hrs 4)  Tizanidine Hcl 2 Mg Tabs (Tizanidine hcl) .... Take 2 tabs daily 5)  Alprazolam 0.5 Mg Tabs (Alprazolam) .Marland KitchenMarland KitchenMarland Kitchen  Take as needed 6)  Reglan 5 Mg Tabs (Metoclopramide hcl) .... Take as needed 7)  Chlorthalidone 25 Mg Tabs (Chlorthalidone) .... Take 1 tab daily 8)  Coumadin 5 Mg Tabs (Warfarin sodium) .... Take as directed by coumadin clinic 9)  Celebrex 200 Mg Caps (Celecoxib) .... Take 1 capsule by mouth two times a day 10)  Glycopyrrolate 2 Mg Tabs (Glycopyrrolate) .... Take as needed 11)  Hydrocodone-acetaminophen 5-500 Mg Tabs (Hydrocodone-acetaminophen) .... Every six hours as needed 12)  Nexium 40 Mg Cpdr (Esomeprazole magnesium) .... Take 1 tab daily 13)  Omega-3 Krill Oil 300 Mg Caps (Krill oil) .... Take 1 tab daily 14)  Calcium-vitamin D 600-200 Mg-unit Tabs (Calcium-vitamin d) .... Take 1 tablet by mouth once a day 15)  Co Q-10 150 Mg Caps (Coenzyme q10) .... Take 1 tab daily 16)  Vitamin B-12 1000 Mcg Tabs (Cyanocobalamin) .... Take 1 tab daily 17)   Nulev 0.125 Mg Tbdp (Hyoscyamine sulfate) .... Take as needed 18)  Benadryl 25 Mg Tabs (Diphenhydramine hcl) .... Take as needed 19)  Robinul-forte 2 Mg Tabs (Glycopyrrolate) .... Take as needed  Patient Instructions: 1)  You do not have pulmonary hypertension 2)  A repeat Sleep study will be obtained 3)  A pulmonary function study will be obtained 4)  No changes in medications for now 5)  Return after above studies are completed, I will call with results   Immunization History:  Influenza Immunization History:    Influenza:  historical (09/22/2009)   Appended Document: Pulmonary Consultation fax Assunta Found

## 2011-02-28 ENCOUNTER — Ambulatory Visit: Payer: Self-pay | Admitting: Adult Health

## 2011-02-28 ENCOUNTER — Encounter (INDEPENDENT_AMBULATORY_CARE_PROVIDER_SITE_OTHER): Payer: Self-pay | Admitting: *Deleted

## 2011-03-05 NOTE — Letter (Signed)
Summary: Appointment - Missed  [facility]  [address], [place] [address]   Phone: [phone]  Fax: [phone]     February 28, 2011 MRN: [id number]   [facility] [address] [place], [place]  [address]   Dear Ms. MARADIAGA,  Our records indicate you missed your appointment on    02/28/11 Joni Reining NP                      It is very important that we reach you to reschedule this appointment. We look forward to participating in your health care needs. Please contact us at the number listed above at your earliest convenience to reschedule this appointment.     Sincerely,    Glass blower/designer

## 2011-03-08 ENCOUNTER — Encounter: Payer: Self-pay | Admitting: *Deleted

## 2011-03-11 ENCOUNTER — Ambulatory Visit: Payer: Self-pay | Admitting: Cardiology

## 2011-03-12 NOTE — Miscellaneous (Signed)
Summary: labs cbcd,cmp,lipid,tsh,bnp,08/14/2010  Clinical Lists Changes  Observations: Added new observation of CALCIUM: 8.9 mg/dL (16/09/9603 54:09) Added new observation of ALBUMIN: 4.1 g/dL (81/19/1478 29:56) Added new observation of PROTEIN, TOT: 6.6 g/dL (21/30/8657 84:69) Added new observation of SGPT (ALT): 21 units/L (08/14/2010 16:29) Added new observation of SGOT (AST): 27 units/L (08/14/2010 16:29) Added new observation of ALK PHOS: 78 units/L (08/14/2010 16:29) Added new observation of CREATININE: 1.15 mg/dL (62/95/2841 32:44) Added new observation of BUN: 19 mg/dL (12/25/7251 66:44) Added new observation of BG RANDOM: 104 mg/dL (03/47/4259 56:38) Added new observation of CO2 PLSM/SER: 26 meq/L (08/14/2010 16:29) Added new observation of CL SERUM: 104 meq/L (08/14/2010 16:29) Added new observation of K SERUM: 3.9 meq/L (08/14/2010 16:29) Added new observation of NA: 142 meq/L (08/14/2010 16:29) Added new observation of LDL: 137 mg/dL (75/64/3329 51:88) Added new observation of HDL: 34 mg/dL (41/66/0630 16:01) Added new observation of TRIGLYC TOT: 271 mg/dL (09/32/3557 32:20) Added new observation of CHOLESTEROL: 225 mg/dL (25/42/7062 37:62) Added new observation of TSH: 1.430 microintl units/mL (08/14/2010 16:29) Added new observation of BNP: 22.0  (08/14/2010 16:29) Added new observation of ABSOLUTE BAS: 0.0 K/uL (08/14/2010 16:29) Added new observation of BASOPHIL %: 0 % (08/14/2010 16:29) Added new observation of EOS ABSLT: 0.0 K/uL (08/14/2010 16:29) Added new observation of % EOS AUTO: 1 % (08/14/2010 16:29) Added new observation of ABSOLUTE MON: 0.3 K/uL (08/14/2010 16:29) Added new observation of MONOCYTE %: 5 % (08/14/2010 16:29) Added new observation of ABS LYMPHOCY: 2.1 K/uL (08/14/2010 16:29) Added new observation of LYMPHS %: 32 % (08/14/2010 16:29) Added new observation of PLATELETK/UL: 340 K/uL (08/14/2010 16:29) Added new observation of RDW: 13.4 %  (08/14/2010 16:29) Added new observation of MCHC RBC: 32.1 g/dL (83/15/1761 60:73) Added new observation of MCV: 95.7 fL (08/14/2010 16:29) Added new observation of HCT: 44.2 % (08/14/2010 16:29) Added new observation of HGB: 14.2 g/dL (71/05/2693 85:46) Added new observation of RBC M/UL: 4.62 M/uL (08/14/2010 16:29) Added new observation of WBC COUNT: 6.4 10*3/microliter (08/14/2010 16:29)

## 2011-03-25 ENCOUNTER — Encounter: Payer: Self-pay | Admitting: *Deleted

## 2011-03-25 ENCOUNTER — Ambulatory Visit (INDEPENDENT_AMBULATORY_CARE_PROVIDER_SITE_OTHER): Payer: BC Managed Care – PPO | Admitting: Cardiology

## 2011-03-25 ENCOUNTER — Encounter: Payer: Self-pay | Admitting: Cardiology

## 2011-03-25 DIAGNOSIS — E785 Hyperlipidemia, unspecified: Secondary | ICD-10-CM

## 2011-03-25 DIAGNOSIS — G4733 Obstructive sleep apnea (adult) (pediatric): Secondary | ICD-10-CM

## 2011-03-25 DIAGNOSIS — I4891 Unspecified atrial fibrillation: Secondary | ICD-10-CM

## 2011-03-25 DIAGNOSIS — Z7901 Long term (current) use of anticoagulants: Secondary | ICD-10-CM

## 2011-03-25 DIAGNOSIS — I10 Essential (primary) hypertension: Secondary | ICD-10-CM

## 2011-03-25 MED ORDER — CHLORTHALIDONE 25 MG PO TABS: 12.5000 mg | ORAL_TABLET | Freq: Every day | ORAL | Status: DC

## 2011-03-25 NOTE — Assessment & Plan Note (Signed)
Patient has moderate hyperlipidemia, but no known vascular disease and no diabetes.  Under the circumstances, pharmacologic intervention is not necessarily warranted.

## 2011-03-25 NOTE — Patient Instructions (Signed)
Your physician recommends that you schedule a follow-up appointment in: 7 MONTHS  Your physician has recommended you make the following change in your medication: DECREASE CHLORTHALIDONE TO 1/2 TABLET DAILY Your physician recommends that you return for lab work in: [place]

## 2011-03-25 NOTE — Assessment & Plan Note (Signed)
Blood pressure control has improved with weight loss.  Since patient is eager to see a tangible result of her weight reduction efforts, chlorthalidone will be reduced to 12.5 mg q.d.  Patient will continue to monitor blood pressure at home.

## 2011-03-25 NOTE — Progress Notes (Signed)
HPI : [name] returns to the office as scheduled for continued assessment and treatment of paroxysmal atrial fibrillation, a history of sleep apnea and pulmonary edema and obesity.  Since her last visit, she has done well from a cardiovascular standpoint.  Exertional dyspnea has resolved.  She is sleeping well without daytime somnolence.  She continues to restrict calories on Weight Watchers with a total weight loss in excess of 100 pounds to date.  She denies orthopnea, PND or pedal edema.  Current Outpatient Prescriptions on File Prior to Visit  Medication Sig Dispense Refill  . ALPRAZolam (XANAX) 0.5 MG tablet Take 0.5 mg by mouth at bedtime as needed.        . Calcium Carbonate-Vitamin D (CALCIUM + D) 600-200 MG-UNIT TABS Take 1 tablet by mouth daily.        . celecoxib (CELEBREX) 200 MG capsule Take 200 mg by mouth daily.        Marland Kitchen co-enzyme Q-10 30 MG capsule Take 30 mg by mouth daily.        . diphenhydrAMINE (BENADRYL) 25 MG tablet Take 25 mg by mouth every 6 (six) hours as needed.        . fentaNYL (DURAGESIC - DOSED MCG/HR) 50 MCG/HR Place 1 patch onto the skin every 3 (three) days.        . fish oil-omega-3 fatty acids 1000 MG capsule Take 2 g by mouth daily.        Marland Kitchen FLUoxetine (PROZAC) 40 MG capsule Take 40 mg by mouth daily.        Marland Kitchen glucosamine-chondroitin 500-400 MG tablet Take 1 tablet by mouth daily.        Marland Kitchen glycopyrrolate (ROBINUL) 2 MG tablet Take 2 mg by mouth as needed.       . hydrocodone-acetaminophen (LORCET-HD) 5-500 MG per capsule Take 1 capsule by mouth every 6 (six) hours as needed.        . hyoscyamine (NULEV) 0.125 MG TBDP Place 0.125 mg under the tongue every 4 (four) hours.        . metoCLOPramide (REGLAN) 5 MG tablet Take 5 mg by mouth daily.        Marland Kitchen olmesartan (BENICAR) 40 MG tablet Take 40 mg by mouth daily. Take 1/2 tab daily      . omeprazole (PRILOSEC) 40 MG capsule Take 40 mg by mouth daily.        Marland Kitchen DISCONTD: chlorthalidone (HYGROTON) 25 MG tablet Take  25 mg by mouth daily.           Allergies  Allergen Reactions  . Codeine   . Penicillins   . Sulfonamide Derivatives     REACTION: itching, GI upset      Past medical history, social history, and family history reviewed and updated.  ROS: See history of present illness.  PHYSICAL EXAM: BP 121/74  Pulse 65  Ht 5\' 4"  (1.626 m)  Wt 236 lb (107.049 kg)  BMI 40.51 kg/m2  SpO2 94%  Weight is 236, 10 pounds less then 4 months ago and 27 pounds less than 18 months ago General-Well developed; no acute distress Body habitus-obese Neck-No JVD; no carotid bruits Lungs-clear lung fields; resonant to percussion Cardiovascular-normal PMI; normal S1 and S2; minimal basilar systolic ejection murmur Abdomen-normal bowel sounds; soft and non-tender without masses or organomegaly Musculoskeletal-No deformities, no cyanosis or clubbing Neurologic-Normal cranial nerves; symmetric strength and tone Skin-Warm, no significant lesions Extremities-distal pulses intact; no edema  EKG:  Sinus bradycardia at a  rate of 55 bpm; ST-T wave abnormalities consistent with inferolateral ischemia or LVH, more prominent than on the most recent tracing from 01/06/2009 at which time atrial fibrillation with a controlled ventricular response was present.  ASSESSMENT AND PLAN:

## 2011-03-25 NOTE — Assessment & Plan Note (Addendum)
Atrial fibrillation has not been documented for quite some time including by 3 weeks of event recording performed in early 2010.  There is some risk of recurrence with the stress of surgery, but her arrhythmia should be easily managed if it does occur peri-operatively  She has new T wave inversions on her EKG, but no symptoms to suggest myocardial ischemia.  Available literature does not suggest benefit for preoperative stress testing or coronary angiography.  Treatment with beta blocker preoperatively might provide modest benefit, but is not necessary.  In general, she is at increased but acceptable risk for general anesthesia and surgery, primarily as a result of obesity and associated respiratory issues.

## 2011-04-03 ENCOUNTER — Encounter: Payer: Self-pay | Admitting: Cardiology

## 2011-04-08 LAB — SEDIMENTATION RATE: Sed Rate: 30 mm/hr — ABNORMAL HIGH (ref 0–22)

## 2011-04-08 LAB — PROTIME-INR: Prothrombin Time: 22.4 seconds — ABNORMAL HIGH (ref 11.6–15.2)

## 2011-04-09 LAB — BASIC METABOLIC PANEL
Chloride: 102 mEq/L (ref 96–112)
Creatinine, Ser: 0.68 mg/dL (ref 0.4–1.2)
GFR calc Af Amer: >60 mL/min (ref >60)
Potassium: 3.4 mEq/L — ABNORMAL LOW (ref 3.5–5.1)

## 2011-04-09 LAB — CBC
MCV: 90.2 fL (ref 78.0–100.0)
RBC: 4.83 MIL/uL (ref 3.87–5.11)
WBC: 7.4 10*3/uL (ref 4.0–10.5)

## 2011-04-09 LAB — DIFFERENTIAL
Eosinophils Absolute: 0 10*3/uL (ref 0.0–0.7)
Lymphocytes Relative: 33 % (ref 12–46)
Lymphs Abs: 2.4 10*3/uL (ref 0.7–4.0)
Monocytes Relative: 5 % (ref 3–12)
Neutrophils Relative %: 62 % (ref 43–77)

## 2011-04-09 LAB — PROTIME-INR: INR: 3.6 — ABNORMAL HIGH (ref 0.00–1.49)

## 2011-04-09 LAB — APTT: aPTT: 45 seconds — ABNORMAL HIGH (ref 24–37)

## 2011-05-07 NOTE — Assessment & Plan Note (Signed)
[facility] HEALTHCARE                       [place] CARDIOLOGY OFFICE NOTE   [name], [name]                       MRN:          [id number]  DATE:01/17/2009                            DOB:          02/12/1951    REFERRING PHYSICIAN:  Corrie Mckusick, [name]   REASON FOR REFERRAL:  Establish with cardiologist.   HISTORY OF PRESENT ILLNESS:  [name] is a 60 year old female patient  previously followed by [name] of [facility] who is now referred to [place] for continued cardiology care.  The  patient has a history of paroxysmal atrial fibrillation.  She has been  placed on Coumadin therapy.  She also has a history of secondary  pulmonary hypertension resulting from sleep apnea as well as restrictive  lung disease from obesity.  She had a heart catheterization done in June  2003 secondary to chest pain and shortness of breath with an RV systolic  pressure of 47, pulmonary artery pressure of 46/25, pulmonary capillary  wedge pressure with a mean of 23, cardiac output of 4.6 and cardiac  index of 1.9.  Her EF was 60% at that time and her coronary arteries  were normal.  She has generally done well over the last several years.  She continues to have chest pain and shortness of breath at times with  exertion.  She also notes an absence in symptoms with exertion at times.  There has really been no significant change in her chest symptoms since  2003.  She sleeps with CPAP.  She denies any paroxysmal nocturnal  dyspnea.  She has lower extremity pain without significant change.  She  recently presented to the emergency room at [facility] with a  syncopal episode.  She was sitting in a chair and all of a sudden felt  like she heard a pop in her head.  She then had some visual changes and  proceeded to pass out.  She went to the emergency room, had a head CT  that was negative except for right maxillary sinusitis.  Her INR at that  time was 1.9.  She was placed on Zithromax for her sinusitis and set up  with outpatient followup.  The patient tells me she had an MRI/MRA done  that apparently had some abnormalities in her carotids.  She has been  set up for followup carotid Dopplers to reassess her carotid anatomy.   PAST MEDICAL HISTORY:  As outlined above.  She has a history of peptic  ulcer disease and underwent [name] and then [name] and  eventually [name].  She also has a history of fibromyalgia, asthma,  hypertension, hearing loss, degenerative disk disease, osteoarthritis,  and a history of vertigo as well as a history of colon polyps, IBS, and  diverticulosis.   SURGICAL HISTORY:  Notable for cholecystectomy, appendectomy, bilateral  tubal ligation, total abdominal hysterectomy and unilateral salpingo-  oophorectomy, and status post recent cataract surgery in June and  September of this year.   MEDICATIONS:  Benicar 40 mg daily, Prozac 40 mg daily, Celebrex 100 mg 1  in the morning, 2 in the evening, Fentanyl patch 50 mcg q.72 h.,  Coumadin as directed, Zanaflex p.r.n.,  Bumetanide 1 mg daily p.r.n., Xanax 0.5 mg q.6 h. p.r.n., Nasonex  p.r.n., Benadryl p.r.n.   ALLERGIES:  PENICILLIN and CODEINE.   SOCIAL HISTORY:  She has never been a smoker.   FAMILY HISTORY:  Her father died after complications from leg  amputation.  He had had a history of peripheral arterial disease as well  as abdominal aortic aneurysm.   REVIEW OF SYSTEMS:  Please see HPI.  Denies any fevers, chills, melena,  hematochezia, hematuria, or dysuria.  Rest of the review of systems are  negative.   PHYSICAL EXAMINATION:  GENERAL:  She is a well-nourished, well-developed  female in no acute distress.  VITAL SIGNS:  Blood pressure is 137/92, pulse 79, weight 271 pounds.  HEENT:  Normal.  NECK:  Without JVD.  LYMPH:  Without lymphadenopathy.  ENDOCRINE:  Without thyromegaly.  CARDIAC:  Normal S1 and S2.  Regular rate  and rhythm.  No murmur.  LUNGS:  With decreased breath sounds bilaterally.  No rales.  No  wheezing.  ABDOMEN:  Soft, nontender with normoactive bowel sounds.  No  organomegaly.  EXTREMITIES:  With trace edema bilaterally.  Calves are soft, nontender.  SKIN:  Warm and dry.  MUSCULOSKELETAL:  Without joint deformity.  NEUROLOGIC:  She is alert and oriented x3.  Cranial nerves [name] through  XII grossly intact.  She does have a mild resting tremor noted.   Electrocardiogram reveals underlying sinus rhythm with a heart rate of  69, large degree of artifact is noted, but she appears to have  nonspecific ST-T wave changes.   ASSESSMENT/PLAN:  1. Chest pain and shortness of breath.  She has a history of secondary      pulmonary hypertension, sleep apnea on CPAP therapy, asthma, and      morbid obesity.  Cardiac catheterization in 2003 demonstrated      normal coronary arteries and evidence of moderate pulmonary      hypertension.  She has had no change in her symptoms since that      time.  She requires no further ischemic workup at this time.  2. Paroxysmal atrial fibrillation on Coumadin therapy.  Her Coumadin      is followed by [name].  We will try to obtain prior records      from [name] office.  She is maintaining sinus rhythm at      this point in time.  3. Recent history of unexplained syncope.  She had an MRI/MRA recently      performed.  She has also been treated for sinusitis.  She has      carotid Dopplers pending.  We can certainly consider proceeding      with further testing to include event monitor as well as an      echocardiogram.  However, we will obtain her prior records from Dr.      Domingo [name] first before we proceed with any further testing.  4. Dyslipidemia.  She had recent lab work done with her primary care      physician that demonstrated normal LFTs, total cholesterol 227,      triglycerides 228, HDL 39, LDL 142.  She has few risk factors for       coronary artery disease and she did have normal coronary arteries      at her catheterization.  If she has any plaque  build up on her      carotid Doppler, she will likely need statin therapy.  She has been      intolerant of STATINS in the past however.  This can be further      addressed by her primary care physician.   DISPOSITION:  The patient will be brought back in followup with Dr.  Dietrich Pates in 1 month or sooner p.r.n.  He will review her records at that  time and decide on further testing if necessary.      Tereso Newcomer, PA-C  Electronically Signed      Gerrit Friends. Dietrich Pates, [name], [facility]  Electronically Signed   SW/MedQ  DD: 01/17/2009  DT: 01/18/2009  Job #: [id number]   cc:   [name], M.D.

## 2011-05-07 NOTE — Letter (Signed)
March 07, 2009    Corrie Mckusick, MD  [phone] [address] [place]. [place] [address]   RE:  [name], [name]  MRN:  [id number]  /  DOB:  1951-07-31   Dear Dana Kotyk,   Ms. Langham returns to the office for continued assessment and treatment  of a history of pulmonary hypertension and sleep apnea, hypertension,  hyperlipidemia, palpitations, and an episode of near or possible  syncope.  Since her last visit, she has been feeling a great deal  better.  Blood pressure control has been good when assessed at home.  She has not had any significant palpitations.  She has carried an event  recorder, and no arrhythmias have been identified.  She has had some odd  feelings in her head, but has not lost consciousness.  She has had no  dyspnea nor chest discomfort.   CURRENT MEDICATIONS:  1. Benicar 40 mg daily.  2. Prozac 40 mg daily.  3. Fentanyl 50 mg every 70-80  hours.  4. Coumadin as directed.  5. Chlorthalidone 25 mg daily.   PHYSICAL EXAMINATION:  GENERAL:  Pleasant, overweight woman in no acute  distress.  Mild tremulousness is noted.  VITAL SIGNS:  The weight is 273, 3 pounds less than at her last visit.  Blood pressure 115/70, heart rate 65 and regular, respirations 12 and  unlabored.  NECK:  No jugular venous distention; no carotid bruits.  LUNGS:  Clear with decreased breath sounds at the bases.  CARDIAC:  Distant first and second heart sounds; bradycardic.  ABDOMEN:  Soft and nontender; no bruits; no organomegaly.  EXTREMITIES:  No edema.   A repeat chemistry profile on diuretic is pending.   IMPRESSION:  [name] has improved symptomatically with better control  of hypertension.  She continues to work on weight loss.  Her pulmonary  hypertension has never been severe and is not clearly limiting.  I  continue to have no documentation for atrial fibrillation.  If we do not  see this arrhythmia over the course of the next year, and cannot  document the circumstances under  which it previously occurred, I would  be inclined to discontinue chronic anticoagulation with warfarin.  For  now, she will continue her current medications and return to see me in 6  months.  Thank you so much for allowing me to share in the care of this  nice woman.    Sincerely,      Gerrit Friends. Dietrich Pates, MD, [facility]  Electronically Signed    RMR/MedQ  DD: 03/07/2009  DT: 03/08/2009  Job #: [id number]

## 2011-05-07 NOTE — Letter (Signed)
February 07, 2009    Assunta Found, MD  [address]  [place], [place]  [address]   RE:  [name], [name]  MRN:  [id number]  /  DOB:  10/06/1951   Dear Vonna Kotyk:   [name] returns to the office for continued assessment and treatment  of multiple issues including pulmonary hypertension, sleep apnea,  hypertension, hyperlipidemia without definite vascular disease, and  obesity.  She reports episodes of palpitations associated with malaise  and headache.  Blood pressure has been substantially elevated during  these spells.  They last for approximately a minute or so and have  occurred fairly frequently in recent weeks.   CURRENT MEDICATIONS:  1. Benicar 40 mg daily.  2. Prozac 40 mg daily.  3. Fentanyl patch 50 mg q.72 h.  4. Warfarin as directed with management from your office.   She was seen in the emergency department approximately a month ago for  headache.  Blood pressure was 175/100.  She was thought to have  sinusitis and was treated with the course of antibiotics without the  advice to more closely follow anticoagulation while she was taking these  drugs.   Records were obtained from [facility] cardiology.  Evaluation, there  did not reveal any significant heart disease.  She had a normal  echocardiogram and a normal stress nuclear study.  She underwent cardiac  catheterization in 2003 with normal results except for mild to moderate  elevation and right-sided pressures.   PHYSICAL EXAMINATION:  GENERAL:  On exam, overweight pleasant, talkative  woman, in no acute distress.  VITAL SIGNS:  The weight is 276, 2 pounds less than at her last visit.  Blood pressure 146/86, heart rate 60 and regular.  NECK:  No jugular venous distention; no carotid bruits.  LUNGS:  Clear.  CARDIAC:  Normal first and second heart sounds; grade 2/6 basilar  systolic ejection murmur.  ABDOMEN:  Obese; otherwise benign.  EXTREMITIES:  No edema; distal pulses intact.   IMPRESSION:  [name] has  spells that are consistent with arrhythmia.  She has a history of atrial fibrillation or atrial flutter, but that  arrhythmia has apparently not been documented in years.  I have no  documentation in our chart at all.  We will proceed with an event  recorder to determine what her heart rhythm is during symptomatic  spells.  Her history is quite suggestive of pheochromocytoma, but the  odds of that disease or so low that we will defer testing for it.  I  will plan to see this nice woman again in 1 month.  PFTs were normal 6  months ago and will not be repeated presently.    Sincerely,      Gerrit Friends. Dietrich Pates, MD, [facility]  Electronically Signed    RMR/MedQ  DD: 02/07/2009  DT: 02/08/2009  Job #: [id number]

## 2011-05-10 NOTE — Discharge Summary (Signed)
[facility]  Patient:    [name], [name] Visit Number: [id number] MRN: [id number]          Service Type: MED Location: [phone] Attending Physician:  Armanda Heritage Dictated by:   Colette Ribas, M.D. Admit Date:  06/07/2002 Disc. Date: 05/08/02                             Discharge Summary  For history of presenting illness and past medical history, please see admission H&P.  HOSPITAL COURSE:  A 60 year old female with two weeks of off and on left sided chest pain. She has a history of fibromyalgia, hypertension, obesity and irritable bowel. She was admitted to telemetry to rule out myocardial infarction. Dr. Domingo Sep was consulted as well.  Dr. Domingo Sep saw the patient and placed her on heparin. Altace 2.5 mg was begun. CPK, MB and troponin were done x3 which were negative. The patient was found to be in atrial flutter soon after admission.  On the following morning, the patient had one episode of chest pain but again had ruled out. Echocardiogram was ordered. Dr. Nobie Putnam and Dr. Sherwood Gambler saw the patient after I had left. CT of the chest was done to rule out coronary embolism which was negative. Coumadin was begun and the patient remained in the hospital until INR was therapeutic.  On May 07, 2002 the patient had been chest pain free and felt quite well. The Coumadin was continued. O2 was discontinued. Ambulation was increased. INR became therapeutic between the window of 2 to 3 and the patient was ready for discharge. Please see progress note on the day of discharge for discharge physical as well as discharge sheet. Follow-up with [facility] and with Dr. Domingo Sep as discussed in the discharge planning.  DISCHARGE MEDICATIONS:  1. Continue home medications stopping Ecotrin.  2. Coumadin 5 mg 2.5 mg q.o.d.Marland Kitchen Dictated by:   Colette Ribas, M.D. Attending Physician:  Armanda Heritage DD:  06/07/02 TD:  06/08/02 Job: [id number]

## 2011-05-10 NOTE — H&P (Signed)
[facility]  Patient:    [name], [name] Visit Number: [id number] MRN: [id number]          Service Type: MED Location: 2A [id number] Attending Physician:  Colette Ribas Dictated by:   Colette Ribas, M.D. Admit Date:  05/04/2002                           History and Physical  DATE OF BIRTH:  [birth year]  PRIMARY PHYSICIAN:  Dr. Colette Ribas.  ADMITTING DIAGNOSIS:  Chest pain, rule out myocardial infarction.  ADDITIONAL DIAGNOSES: 1. Fibromyalgia. 2. Irritable bowel syndrome. 3. Peptic ulcer disease. 4. Obesity. 5. Hypertension.  HISTORY OF PRESENTING ILLNESS:  [age]-year-old female who off and on for two weeks has had pain in the center of the chest, sometimes radiating to the left shoulder, not really related to any exertion.  Now for one day, she has had the same left-sided chest pain with nausea and left shoulder radiation. She said in the office that it felt "like an elephant was sitting on her chest."  When I saw her in the office, the pain was about a 3/10.  She was given nitroglycerin which improved the pain; she was also given 325 mg of coated aspirin as well.  Blood pressures have otherwise been okay and no other respiratory symptoms and has been doing relatively well from a GI standpoint and her fibromyalgia standpoint.  PAST MEDICAL HISTORY:  Hypertension, obesity, irritable bowel, fibromyalgia, peptic ulcer disease.  PAST SURGICAL HISTORY:  Revision jejunostomy in December of 2002, total abdominal hysterectomy.  ALLERGIES:  PENICILLIN and CODEINE causing itch and rash.  SOCIAL HISTORY:  Does not drink nor smoke.  No illicit drug use.  Occupation: Disabled.  FAMILY HISTORY:  Significant for COPD and other asthma-related problems.  No coronary artery disease.  No diabetes.  MEDICATIONS ON ADMISSION: 1. Diovan 80 mg q.d. 2. Zelnorm 6 mg q.d. 3. NuLev p.r.n. 4. Darvocet and Ultram p.r.n.  PHYSICAL  EXAMINATION:  VITAL SIGNS:  Blood pressure 110/90, pulse 72, respirations 18.  Weight estimated at 300+.  GENERAL:  When I saw the patient, she was pleasant and somewhat nervous but in no acute distress and chest-pain-free after the one nitroglycerin tablet.  HEENT:  Normocephalic, atraumatic.  Pupils are equal to light.  Extraocular muscles are intact.  NECK:  No JVD.  CHEST:  Clear to auscultation bilaterally.  CARDIOVASCULAR:  Regular rate and rhythm.  Normal S1 and S2.  No S3, murmurs, gallops or rubs.  ABDOMEN:  Bowel sounds positive.  Soft, nontender and nondistended.  EXTREMITIES:  No cyanosis, clubbing, erythema and no edema.  LABORATORY AND ACCESSORY DATA:  ECG showed normal sinus rhythm with no acute ST changes.  ASSESSMENT:  [age]-year-old with left-sided chest pain.  PLAN: 1. Admit to 2-A telemetry to rule out myocardial infarction. 2. Serial CPKs with MB and troponin. 3. ECG on admission and daily. 4. Continue current medications. 5. Consult Dr. Netta Cedars for further risk stratification. 6. Chest x-ray on admission, including CBC and Chem-12. 7. Fasting lipids in the morning. Dictated by:   Colette Ribas, M.D. Attending Physician:  Colette Ribas DD:  05/04/02 TD:  05/05/02 Job: [id number]

## 2011-05-10 NOTE — Cardiovascular Report (Signed)
[place]  Patient:    [name], [name] Visit Number: [id number] MRN: [id number]          Service Type: MED Location: [phone] Attending Physician:  Armanda Heritage Dictated by:   Delrae Rend, M.D. Proc. Date: 06/10/02 Admit Date:  06/07/2002 Discharge Date: 06/10/2002                          Cardiac Catheterization  ADDENDUM:  INDICATIONS FOR PROCEDURE: Indication for right heart catheterization was shortness of breath, obstructive sleep apnea, and pulmonary hypertension. Dictated by:   Delrae Rend, M.D. Attending Physician:  Armanda Heritage DD:  06/10/02 TD:  06/11/02 Job: [id number]

## 2011-05-10 NOTE — Cardiovascular Report (Signed)
[place]  Patient:    [name], [name] Visit Number: [id number] MRN: [id number]          Service Type: MED Location: [phone] Attending Physician:  Armanda Heritage Dictated by:   Delrae Rend, M.D. Proc. Date: 06/10/02 Admit Date:  06/07/2002 Discharge Date: 06/10/2002   CC:         [facility]  [facility]  A. Kem Boroughs, M.D., [place] [facility]   Cardiac Catheterization  REFERRING PHYSICIAN: Dr. Phillips Odor, [facility].  PROCEDURES PERFORMED: 1. Right and left heart catheterization - hemodynamic monitoring of right    heart pressures and calculation of cardiac output by Fick method. 2. Left ventriculography. Selective left and right coronary arteriography. 3. Right femoral angiography.  INDICATIONS: The patient is a 59 year old female with a history of hypertension with paroxysmal atrial fibrillation and morbid obesity with obstructive sleep apnea, presents for evaluation of atrial flutter and she was admitted to [facility] with chest pain, which is suggestive of angina pectoris. Given her multiple cardiac risk factors and morbid obesity, it was best deemed that her symptoms could be evaluated by cardiac catheterization to evaluate for suspected coronary artery disease.  HEMODYNAMIC DATA:  Right heart catheterization: 1. The right atrial mean pressure was 17 mmHg. 2. The right ventricle pressure is 47 with an end-diastolic pressure of    22 mmHg. The pulmonary arterial pressures were 46/25. The pulmonary    capillary wedge mean was 23 mmHg. 3. The pulmonary arterial saturation was 64% and the femoral arterial    saturation was 93%. 4. The cardiac output by Fick method was 4.6 with a cardiac index of 1.9.  Left heart catheterization: 1. The left ventricular pressures were 148 with an end-diastolic pressure of    22 mmHg. The aortic pressures were 160/93 with  a mean of 119 mmHg. There    was no pressure gradient across the aortic valve. 2. Left ventricle: The left ventricular systolic function is normal. The    ejection fraction is estimated at 60%. There was no mitral regurgitation.  Right coronary artery: The right coronary artery is a large caliber vessel. It is a dominant artery. It is smooth and is disease free.  Left main coronary artery: The left main coronary artery is large caliber vessel. It is disease free.  Left anterior descending artery: The left anterior descending artery is a large caliber vessel in the proximal segment. In the mid segment it gives origin to a very large diagonal #1, which acts like a optional LAD. The diagonal #1 is larger than the LAD itself. The LAD ends out the apex. The diagonal #1 supplies part of the anterior and also lateral part of the heart. These vessels are normal.  Circumflex coronary artery: The circumflex coronary artery is a large caliber vessel. It gives origin to a large obtuse marginal #1. It is disease-free.  Intermediate coronary artery: The intermediate coronary artery is a large caliber vessel. It is disease-free.  Right femoral angiography had good arterial axis site.  IMPRESSIONS: 1. Normal left ventricular systolic function with elevated left ventricular    end-diastolic pressure, most probably secondary to hypertensive heart    disease. 2. Moderate pulmonary hypertension probably secondary to obstructive sleep    apnea and morbid obesity. 3. Normal coronary arteries.  RECOMMENDATIONS: Weight loss and aggressive control of hypertension as indicated. The patient will follow up with Dr. Phillips Odor and also with  Dr. Kem Boroughs.  TECHNIQUE OF THE PROCEDURE: Under the usual sterile precautions using the 8 French right femoral venous access and 6 French right femoral artery access, right and left heart catheterization was performed.  TECHNIQUE OF RIGHT HEART CATHETERIZATION: A  Swan-Ganz catheter was easily advanced through the inferior vena cava through the venous sheath and the catheter was manipulated and pulmonary capillary wedge was easily obtained. The pulmonary arterial saturation was also obtained prior to obtaining pulmonary capillary wedge. The hemodynamic monitoring of pulmonary capillary wedge, pulmonary artery, right ventricle, and right atrium was performed and the then the catheter was pulled out of the body.  TECHNIQUE OF LEFT HEART CATHETERIZATION: A 6 [place] multipurpose B2 catheter was advanced to the ascending aorta over a 0.035 mm J wire. The catheter was gently advanced to the left ventricle and left ventricular pressures were monitored. Hand contrast injection of the left ventricle was performed both in the LAO and RAO projection. Then the catheter was flushed with saline and pulled back into the ascending aorta and pressure gradient across the aortic valve was monitored. The right coronary artery was then selectively engaged and angiography was performed. In a similar fashion, the left main coronary artery was selectively engaged and angiography was performed. Then the catheter was pulled out of the body and the patient was transferred to recovery in a stable condition. Dictated by:   Delrae Rend, M.D. Attending Physician:  Armanda Heritage DD:  06/10/02 TD:  06/11/02 Job: [id number]

## 2011-05-10 NOTE — Discharge Summary (Signed)
[place]. [facility]  Patient:    [name], [name] Visit Number: [id number] MRN: [id number]          Service Type: MED Location: [phone] Attending Physician:  Armanda Heritage Dictated by:   Abelino Derrick, P.A.C. Admit Date:  06/07/2002 Discharge Date: 06/10/2002   CC:         Assunta Found, M.D., [address] [place], [place]. [address]   Discharge Summary  DISCHARGE DIAGNOSES: 1. Chest pain consistent with unstable angina. Catheterization this admission    revealing normal coronaries and normal LV function. 2. History of PAF, normal sinus rhythm during this admission. 3. Coumadin therapy. 4. Obesity and sleep apnea. 5. Remote peptic ulcer disease, status post surgery. 6. Hypertension, under better control this admission. 7. Irritable bowel syndrome.  HOSPITAL COURSE: The patient is a 60 year old female followed by Dr. Phillips Odor who was recently admitted to [facility] with chest pain. She was evaluated by Dr. Domingo Sep. It was felt that it may be best to proceed with diagnostic catheterization because of her size and nuclear study may be inaccurate. We can also get right heart pressures. She was scheduled for this on June 21, 2002, however, she returned to the office in [place] on June 07, 2002 with recurrent chest pain, consistent with unstable angina. She was admitted to telemetry and started on Heparin. Her Coumadin was held. CKMBs and troponins were negative. She was started on IV nitrates. Her Diovan was increased for better blood pressure. Ultimately, her INR dropped low enough that she could be cathed. She was cathed on the 19th by Dr. Nadara Eaton. This revealed normal coronaries and normal LV function with moderate pulmonary hypertension and elevated LVEDP. Dr. Voncille Lo recommended weight loss and control of her hypertension. We feel that she can be discharged later on the 19th.  DISCHARGE MEDICATIONS: 1. Coumadin is to be resumed at 5 mg  per day or as directed by Dr. Phillips Odor. 2. She will take coated aspirin until her INR is therapeutic. 3. We have increased her Diovan to 160 mg per day. 4. If she continues to be hypertensive, we may consider adding HCTZ. 5. Lexapro 20 mg per day. 6. Protonix 40 mg per day.  LABORATORY DATA: INR is 1.6 on the 18th. White count 7.3, hemoglobin 12.1, hematocrit 35.8, platelets 269, TSH 1.96, sodium 141, potassium 3.9, BUN 9, creatinine 0.7, hemoglobin AlC 5.4, CKMB and troponins are negative. Lipid profile shows cholesterol of 178, HDL 35, LDL 110.  DIAGNOSTIC STUDIES: EKG sinus rhythm, sinus bradycardia with low volts.  DISPOSITION: The patient is discharged in stable condition and will follow-up with Dr. Domingo Sep in [place] and Dr. Phillips Odor. Dictated by:   Abelino Derrick, P.A.C. Attending Physician:  Armanda Heritage DD:  06/10/02 TD:  06/12/02 Job: [id number]

## 2011-05-24 HISTORY — PX: TOTAL KNEE ARTHROPLASTY: SHX125

## 2011-05-31 ENCOUNTER — Encounter: Payer: Self-pay | Admitting: Cardiology

## 2011-06-10 ENCOUNTER — Other Ambulatory Visit: Payer: Self-pay | Admitting: Orthopedic Surgery

## 2011-06-10 ENCOUNTER — Encounter (HOSPITAL_COMMUNITY): Payer: BC Managed Care – PPO

## 2011-06-10 ENCOUNTER — Ambulatory Visit (HOSPITAL_COMMUNITY)
Admission: RE | Admit: 2011-06-10 | Discharge: 2011-06-10 | Disposition: A | Discharge status: Home / Self Care | Payer: BC Managed Care – PPO | Source: Ambulatory Visit | Attending: Orthopedic Surgery | Admitting: Orthopedic Surgery

## 2011-06-10 ENCOUNTER — Other Ambulatory Visit (HOSPITAL_COMMUNITY): Payer: Self-pay | Admitting: Orthopedic Surgery

## 2011-06-10 DIAGNOSIS — Z01818 Encounter for other preprocedural examination: Secondary | ICD-10-CM

## 2011-06-10 DIAGNOSIS — M1710 Unilateral primary osteoarthritis, unspecified knee: Secondary | ICD-10-CM | POA: Insufficient documentation

## 2011-06-10 DIAGNOSIS — Z01812 Encounter for preprocedural laboratory examination: Secondary | ICD-10-CM | POA: Insufficient documentation

## 2011-06-10 DIAGNOSIS — I10 Essential (primary) hypertension: Secondary | ICD-10-CM | POA: Insufficient documentation

## 2011-06-10 DIAGNOSIS — Z79899 Other long term (current) drug therapy: Secondary | ICD-10-CM | POA: Insufficient documentation

## 2011-06-10 LAB — URINALYSIS, ROUTINE W REFLEX MICROSCOPIC
Glucose, UA: NEGATIVE mg/dL
Leukocytes, UA: NEGATIVE
Nitrite: NEGATIVE
Specific Gravity, Urine: 1.02 (ref 1.005–1.030)
pH: 6 (ref 5.0–8.0)

## 2011-06-10 LAB — SURGICAL PCR SCREEN: Staphylococcus aureus: POSITIVE — AB

## 2011-06-10 LAB — CBC
Hemoglobin: 13.6 g/dL (ref 12.0–15.0)
MCH: 30 pg (ref 26.0–34.0)
RBC: 4.54 MIL/uL (ref 3.87–5.11)
WBC: 9.1 10*3/uL (ref 4.0–10.5)

## 2011-06-10 LAB — COMPREHENSIVE METABOLIC PANEL
ALT: 17 U/L (ref 0–35)
AST: 20 U/L (ref 0–37)
Albumin: 3.5 g/dL (ref 3.5–5.2)
CO2: 27 mEq/L (ref 19–32)
Chloride: 103 mEq/L (ref 96–112)
GFR calc non Af Amer: 59 mL/min — ABNORMAL LOW (ref >60)
Sodium: 140 mEq/L (ref 135–145)
Total Bilirubin: 0.3 mg/dL (ref 0.3–1.2)

## 2011-06-10 LAB — APTT: aPTT: 31 seconds (ref 24–37)

## 2011-06-17 ENCOUNTER — Inpatient Hospital Stay (HOSPITAL_COMMUNITY)
Admission: RE | Admit: 2011-06-17 | Discharge: 2011-06-20 | DRG: 209 | Disposition: A | Discharge status: Skilled Nursing Facility | Payer: BC Managed Care – PPO | Source: Ambulatory Visit | Attending: Orthopedic Surgery | Admitting: Orthopedic Surgery

## 2011-06-17 DIAGNOSIS — G8929 Other chronic pain: Secondary | ICD-10-CM | POA: Diagnosis present

## 2011-06-17 DIAGNOSIS — N289 Disorder of kidney and ureter, unspecified: Secondary | ICD-10-CM | POA: Diagnosis present

## 2011-06-17 DIAGNOSIS — Z01812 Encounter for preprocedural laboratory examination: Secondary | ICD-10-CM

## 2011-06-17 DIAGNOSIS — D62 Acute posthemorrhagic anemia: Secondary | ICD-10-CM | POA: Diagnosis not present

## 2011-06-17 DIAGNOSIS — I10 Essential (primary) hypertension: Secondary | ICD-10-CM | POA: Diagnosis present

## 2011-06-17 DIAGNOSIS — G4733 Obstructive sleep apnea (adult) (pediatric): Secondary | ICD-10-CM | POA: Diagnosis present

## 2011-06-17 DIAGNOSIS — M545 Low back pain, unspecified: Secondary | ICD-10-CM | POA: Diagnosis present

## 2011-06-17 DIAGNOSIS — M1710 Unilateral primary osteoarthritis, unspecified knee: Principal | ICD-10-CM | POA: Diagnosis present

## 2011-06-17 DIAGNOSIS — K219 Gastro-esophageal reflux disease without esophagitis: Secondary | ICD-10-CM | POA: Diagnosis present

## 2011-06-17 DIAGNOSIS — R609 Edema, unspecified: Secondary | ICD-10-CM | POA: Diagnosis present

## 2011-06-17 DIAGNOSIS — E871 Hypo-osmolality and hyponatremia: Secondary | ICD-10-CM | POA: Diagnosis not present

## 2011-06-17 DIAGNOSIS — I9589 Other hypotension: Secondary | ICD-10-CM | POA: Diagnosis not present

## 2011-06-17 DIAGNOSIS — IMO0001 Reserved for inherently not codable concepts without codable children: Secondary | ICD-10-CM | POA: Diagnosis present

## 2011-06-17 DIAGNOSIS — H9190 Unspecified hearing loss, unspecified ear: Secondary | ICD-10-CM | POA: Diagnosis present

## 2011-06-17 LAB — TYPE AND SCREEN
ABO/RH(D): A POS
Antibody Screen: NEGATIVE

## 2011-06-17 NOTE — Op Note (Signed)
[name], [name] NO.:  [id number]  MEDICAL RECORD NO.:  [id number]  LOCATION:  0006                         FACILITY:  [facility]  PHYSICIAN:  Ollen Gross, M.D.    DATE OF BIRTH:  1951/08/03  DATE OF PROCEDURE:  06/17/2011 DATE OF DISCHARGE:                              OPERATIVE REPORT   PREOPERATIVE DIAGNOSIS:  Osteoarthritis, right knee.  POSTOPERATIVE DIAGNOSIS:  Osteoarthritis, right knee.  PROCEDURE:  Right total knee arthroplasty.  SURGEON:  Ollen Gross, M.D.  ASSISTANT:  Alexzandrew L. Perkins, P.A.C.  ANESTHESIA:  General.  ESTIMATED BLOOD LOSS:  Minimal.  DRAIN:  Hemovac x1.  TOURNIQUET TIME:  35 minutes at 300 mmHg.  COMPLICATIONS:  None.  CONDITION.:  Stable to recovery.  BRIEF CLINICAL NOTE:  [name] is a 60 year old female with advanced end-stage arthritis of the right knee with progressively worsening pain and dysfunction.  She has failed nonoperative management and presents for total knee arthroplasty.  PROCEDURE IN DETAIL:  After successful administration of general anesthetic, a tourniquet was placed on her right thigh and right lower extremity prepped and draped in the usual sterile fashion.  Extremity was wrapped in Esmarch, knee flexed, tourniquet inflated to 300 mmHg. Midline incision was made with a 10 blade through subcutaneous tissue to the level of the extensor mechanism.  A fresh blade was used to make a medial parapatellar arthrotomy.  Soft tissue on the proximal medial tibia was subperiosteally elevated to the joint line with a knife and into the semimembranosus bursa with a Cobb elevator.  Soft tissue laterally was elevated with attention being paid to avoiding the patellar tendon on the tibial tubercle.  The patella was everted, knee flexed 90 degrees, and ACL and PCL removed.  Drill was used a create a starting hole in the distal femur and the canal was thoroughly irrigated.  The 5-degree right valgus  alignment guide was placed and the distal femoral cutting block was pinned to remove 10 mm off the distal femur.  Distal femoral resection was made with an oscillating saw.  The tibia was subluxed forward and the menisci removed.  Extramedullary tibial alignment guide was placed referencing proximally at the medial aspect of the tibial tubercle and distally along the second metatarsal axis of tibial crest.  Block was pinned to remove 2 mm off the more deficient medial side.  Tibial resection was made with an oscillating saw.  Size 3 was the most appropriate tibial component.  The proximal tibia was prepared with a modular drill and keel punch for the size 3.  Femoral sizing guide was placed and size 4 narrow was the most appropriate.  Rotation was marked at the epicondylar axis and confirmed by creating a rectangular flexion gap at 90 degrees.  A block was pinned in this rotation and anterior-posterior chamfer cuts were made. Intercondylar block was placed and that cut was made.  The size 4 narrow posterior stabilized femoral trial was placed.  A 10-mm posterior stabilized rotating platform insert trial was placed.  With the 10, full extension was achieved with excellent varus-valgus and anterior- posterior balance throughout full range of motion.  The patella was everted, thickness measured  to be 24 mm.  Freehand resection was measured to 14 mm, the 35 template was placed, lug holes were drilled, trial patella was placed and the patella tracks normally.  Osteophytes were removed off the posterior femur with the trial in place.  All trials were removed and the cut bone surfaces prepared with pulsatile lavage.  Cement was mixed and once ready for implantation, the size 3 mobile bearing tibial tray, size 4 narrow posterior stabilized femur and 35 patella were cemented into place and the patella was held with a clamp.  Trial 10-mm insert was placed, knee held in full extension and all  extruded cement removed.  When the cement had fully hardened, then the permanent 10-mm posterior stabilized rotating platform insert was placed in the tibial tray.  Wound was copiously irrigated with saline solution and the arthrotomy closed over Hemovac drain with interrupted #1 PDS.  Flexion against gravity to 135 degrees.  Patella tracks normally.  Tourniquet was released after total time of 35 minutes. Subcu was closed with interrupted 2-0 Vicryl, subcuticular running 4-0 Monocryl.  Catheter for Marcaine pain pump was placed and pump initiated.  Incisions were cleaned and dried and Steri-Strips and bulky sterile dressing applied.  She was then placed into a knee immobilizer, awakened and transported to recovery in stable condition.     Ollen Gross, M.D.     FA/MEDQ  D:  06/17/2011  T:  06/17/2011  Job:  [id number]  Electronically Signed by Ollen Gross M.D. on 06/17/2011 05:03:28 PM

## 2011-06-18 LAB — CBC
HCT: 34.6 % — ABNORMAL LOW (ref 36.0–46.0)
MCHC: 32.4 g/dL (ref 30.0–36.0)
MCV: 91.8 fL (ref 78.0–100.0)
Platelets: 281 10*3/uL (ref 150–400)
RDW: 13 % (ref 11.5–15.5)
WBC: 9.3 10*3/uL (ref 4.0–10.5)

## 2011-06-18 LAB — BASIC METABOLIC PANEL
BUN: 12 mg/dL (ref 6–23)
Chloride: 103 mEq/L (ref 96–112)
Creatinine, Ser: 0.85 mg/dL (ref 0.50–1.10)
GFR calc Af Amer: >60 mL/min (ref >60)
GFR calc non Af Amer: >60 mL/min (ref >60)

## 2011-06-18 LAB — ABO/RH: ABO/RH(D): A POS

## 2011-06-19 LAB — BASIC METABOLIC PANEL
CO2: 26 mEq/L (ref 19–32)
Chloride: 103 mEq/L (ref 96–112)
Chloride: 99 mEq/L (ref 96–112)
Creatinine, Ser: 1.64 mg/dL — ABNORMAL HIGH (ref 0.50–1.10)
GFR calc Af Amer: >60 mL/min (ref >60)
GFR calc Af Amer: 39 mL/min — ABNORMAL LOW (ref >60)
GFR calc non Af Amer: >60 mL/min (ref >60)
Potassium: 4 mEq/L (ref 3.5–5.1)
Potassium: 4 mEq/L (ref 3.5–5.1)
Sodium: 137 mEq/L (ref 135–145)
Sodium: 134 mEq/L — ABNORMAL LOW (ref 135–145)

## 2011-06-19 LAB — CBC
HCT: 30.1 % — ABNORMAL LOW (ref 36.0–46.0)
MCHC: 32.6 g/dL (ref 30.0–36.0)
Platelets: 253 10*3/uL (ref 150–400)
RDW: 13.1 % (ref 11.5–15.5)
WBC: 9.8 10*3/uL (ref 4.0–10.5)

## 2011-06-20 LAB — BASIC METABOLIC PANEL
BUN: 21 mg/dL (ref 6–23)
CO2: 29 mEq/L (ref 19–32)
Chloride: 101 mEq/L (ref 96–112)
Creatinine, Ser: 1.16 mg/dL — ABNORMAL HIGH (ref 0.50–1.10)
GFR calc Af Amer: 58 mL/min — ABNORMAL LOW (ref >60)
Potassium: 3.8 mEq/L (ref 3.5–5.1)

## 2011-06-20 LAB — CBC
HCT: 27.7 % — ABNORMAL LOW (ref 36.0–46.0)
MCV: 92.6 fL (ref 78.0–100.0)
RBC: 2.99 MIL/uL — ABNORMAL LOW (ref 3.87–5.11)
RDW: 13.2 % (ref 11.5–15.5)
WBC: 10.3 10*3/uL (ref 4.0–10.5)

## 2011-07-03 NOTE — Discharge Summary (Signed)
[name], [name] NO.:  [id number]  MEDICAL RECORD NO.:  [id number]  LOCATION:  1615                         FACILITY:  [facility]  PHYSICIAN:  [name], M.D.    DATE OF BIRTH:  [birth year]  DATE OF ADMISSION:  06/17/2011 DATE OF DISCHARGE:  06/20/2011                        DISCHARGE SUMMARY - REFERRING   ADMITTING DIAGNOSES: 1. Osteoarthritis, right knee. 2. Vertigo. 3. History of tinnitus. 4. Right ear deafness. 5. History of anxiety. 6. Sleep apnea. 7. Hypertension. 8. Past history of atrial fibrillation. 9. History of diverticulosis. 10.Past history of gastric ulcer disease. 11.Urinary incontinence. 12.Fibromyalgia. 13.Gout. 14.Peripheral edema.  DISCHARGE DIAGNOSES: 1. Osteoarthritis, right knee, status post right total knee     replacement arthroplasty. 2. Mild postop hypotension, improved. 3. Postop acute blood loss anemia, did not require transfusion. 4. Postop mild hyponatremia, improved. 5. Mild renal insufficiency, improved. 6. Vertigo. 7. History of tinnitus. 8. Right ear deafness. 9. History of anxiety. 10.Sleep apnea. 11.Hypertension. 12.Past history of atrial fibrillation. 13.History of diverticulosis. 14.Past history of gastric ulcer disease. 15.Urinary incontinence. 16.Fibromyalgia. 17.Gout. 18.Peripheral edema.  PROCEDURES:  June 17, 2011 right total knee.  SURGEON:  [name], P.A.C.  ASSISTANT:  [name], P.A.C.  ANESTHESIA:  General.  CONSULTS:  None.  BRIEF HISTORY:  The patient is 60 year old female who has been seen by Dr. Lequita [name] for ongoing end-stage arthritis of right knee, progressive worsening pain and dysfunction, failed nonoperative management, now presents for total knee arthroplasty.  LABORATORY DATA:  CBC on admission showed hemoglobin of 13.6, hematocrit of 41.6, white cell count 9.1, platelets 320,000.  PT/INR preop 13.3 and 0.99 with PTT of 31.  Chem panel on  admission minimally elevated BUN of 25.  Remaining Chem panel within normal limits.  Preop UA was negative. Blood group type A positive.  Nasal swabs were positive for staph aureus but were negative for MRSA.  Serial CBCs were followed throughout the hospital course.  Hemoglobin dropped from 11.2 to 9.8.  Last H&H of 9.0 and 27.7.  The patient is asymptomatic but her white count remained normal.  Serial BMETs were followed for 3 days.  The creatinine did go up from 0.96 up to 1.64.  This was likely due to hypotension, was back down to 1.16.  Sodium dropped from 140 to 134, back up to 137. Remaining electrolytes remained within normal limits.  X-rays, two-view chest x-ray dated January 09, 2011, stable.  No active cardiopulmonary disease.  EKG dated March 25, 2011, sinus bradycardia, T-wave abnormality, unable to read signature, looks like Dr. Dietrich [name].  HOSPITAL COURSE:  The patient was admitted to [facility], taken to OR, underwent above-stated procedure without complication.  The patient tolerated the procedure well, later transferred to the recovery room on orthopedic floor, started on p.o. and IV analgesic pain control following surgery.  Doing pretty well on the morning of day 1, although did have a little bit of issues with some low blood pressure.  She wanted look into a skilled facility, she got social work involved.  She was started back on her home meds.  She had excellent urinary output, had to actually give a little bit of  extra fluids just to maintain pressure.  FL-2 was signed and sent out.  Started getting up out of bed on day 1.  By day 2, she was up walking about 30 to 40 feet, slowly progressing.  She still had some low pressures but was asymptomatic with this.  We put her blood pressure medications on parameters.  With the low pressures, she actually was given some fluids to maintain.  She had a little bit increase in her creatinine on the afternoon of day  two because of the low fluids but she got better with this.  After stabilization of her pressure, she felt good and again she responded to the fluids.  Her hemoglobin went down but she is asymptomatic with getting up, it is just more of the pressure issue but we put her on iron supplementation.  FL-2 was send out by day 2.  Morning of day 3, it was found there was bed over at [facility].  Dressing changes initially on day 2 and day 3.  Incision was healing well, no problems. By morning rounds on day 3, she was doing well, seen by Dr. Lequita [name]. Once bed available, she was transferred out at that time.  DISCHARGE PLAN: 1. The patient will be transferred to [facility]. 2. Discharge diagnoses, please see above.  CURRENT MEDICATIONS AT THE TIME OF TRANSFER: 1. Xarelto 10 mg daily for 3 weeks and discontinue Xarelto. 2. Colace 100 mg p.o. b.i.d. 3. Fentanyl and Duragesic patch 50 transdermal, change every 72 hours,     removing and apply new patch. 4. Prozac 20 mg daily. 5. Chlorthalidone 25 mg daily. 6. Benicar 20 mg daily. 7. Prilosec 40 mg p.o. q.h.s. 8. Levsin 0.125 mg daily. 9. Robinul 2 mg daily. 10.Zanaflex 4 mg p.o. q.h.s. 11.Tylenol 325 one or two every 4 to 6 hours as needed for mild pain,     temperature or headache. 12.Robaxin 500 mg p.o. q.6-8h. p.r.n. spasm. 13.Norco 5 mg 1 or 2 every 4 to 6 hours as needed for pain. 14.Claritin 10 mg p.o. daily p.r.n. 15.Xanax 0.5 mg p.o. b.i.d. p.r.n. anxiety.  DIET:  Heart-healthy diet.  ACTIVITY:  She is weightbearing as tolerated.  Total knee protocol.  PT and OT for gait training ambulation, ADLs, range of motion, strengthening exercises.  Please note she may start showering, however, do not submerge incision under water.  She needs a daily dressing change.  FOLLOWUP:  She needs to follow up with Dr. Lequita [name] in the office in approximately 2 weeks either on Tuesday July 10 or Thursday July 12. Please  contact the office at [phone] to help arrange appointment for followup care of this patient.  DISPOSITION:  [facility].  CONDITION ON DISCHARGE:  Slowly improving.     Dana L. Julien Girt, P.A.C.   ______________________________ [name], M.D.    ALP/MEDQ  D:  06/20/2011  T:  06/20/2011  Job:  [id number]  cc:   Corrie Mckusick, M.D. Fax: [phone]  Electronically Signed by Patrica Duel P.A.C. on 06/24/2011 08:09:04 AM Electronically Signed by [name] M.D. on 07/03/2011 12:32:30 PM

## 2011-07-08 NOTE — H&P (Signed)
[name], [name] NO.:  [id number]  MEDICAL RECORD NO.:  [id number]  LOCATION:  1615                         FACILITY:  [facility]  PHYSICIAN:  Ollen Gross, M.D.    DATE OF BIRTH:  Dec 29, 1950  DATE OF ADMISSION:  06/17/2011 DATE OF DISCHARGE:  06/20/2011                             HISTORY & PHYSICAL   CHIEF COMPLAINT:  Right knee pain.  HISTORY OF PRESENT ILLNESS:  The patient is a 60 year old female who has been seen by Dr. Lequita [name] for ongoing right knee pain and progressive arthritis, it has been progressive in nature and failed nonoperative management, now presents for total knee arthroplasty.  ALLERGIES:  PENICILLIN causes itching.  CODEINE causes rash and itching.  INTOLERANCES:  SULFA DRUGS causes sickness, nausea and vomiting.  CURRENT MEDICATIONS:  Tizanidine, Celebrex, Prozac, chlorthalidone, Benicar, hydrocodone, fentanyl patch, Prilosec, vitamin B12, Colace, Robinul Forte, Reglan, NuLev and also Xanax.  PAST MEDICAL HISTORY:  Right ear deafness with some hearing loss in the left ear, vertigo, sleep apnea which she uses a CPAP, hypertension, varicose veins.  Past history of gastric ulceration, urinary incontinence, fibromyalgia, history of gout, osteoarthritis, past history of peripheral edema.  PAST SURGICAL HISTORY:  Tonsillectomy, appendectomy, gallbladder surgery, subtotal gastrectomy, hysterectomy, Roux-en-Y correction, one- sided oophorectomy.  FAMILY HISTORY:  Father deceased at age 66, emphysema and arterial sclerosis.  Mother living, macular degeneration, she is 40.  SOCIAL HISTORY:  Married, retired Public house manager.  Nonsmoker.  No alcohol.  Four children.  She does not want to look into skilled rehab versus going home and will see how she does.  REVIEW OF SYSTEMS:  GENERAL:  No fevers, chills or night sweats. NEUROLOGIC:  She does have some hearing loss and some dizziness.  No seizures, syncope.  RESPIRATORY:  No shortness breath,  productive cough or hemoptysis.  CARDIOVASCULAR:  No chest pain or orthopnea.  GI:  No nausea, diarrhea or constipation.  GU:  Little bit of urinary frequency and nocturia and little bit of incontinence.  MUSCULOSKELETAL:  Knee pain.  PHYSICAL EXAMINATION:  VITAL SIGNS:  Pulse 64, respirations 12, blood pressure 142/78. GENERAL:  A 59-year white female, well-nourished, well-developed, no acute distress, overweight, alert, oriented and cooperative, hard of hearing, she is accompanied by her husband. HEENT:  Normocephalic, atraumatic.  Pupils are round and reactive.  EOMs are intact. NECK:  Supple.  No bruits. CHEST:  Clear. HEART:  Regular rate and rhythm without murmur.  S1 and S2 noted. ABDOMEN:  Soft, nontender.  Bowel sounds present. RECTAL, BREASTS AND GENITALIA:  Not done and not part of present illness. EXTREMITIES:  Right knee range of motion 5 to 120.  Patellofemoral crepitus, tender more medial than lateral.  No instability.  IMPRESSION:  Osteoarthritis, right knee.  PLAN:  The patient was admitted to [facility] to undergo right total knee replacement arthroplasty.  Surgery will be performed by Dr. Ollen Gross.     [name], P.A.C.   ______________________________ Ollen Gross, M.D.    ALP/MEDQ  D:  06/25/2011  T:  06/25/2011  Job:  [id number]  Electronically Signed by Patrica Duel P.A.C. on 07/04/2011 10:39:19 AM Electronically Signed by Ollen Gross  M.D. on 07/08/2011 06:52:22 AM

## 2011-07-22 ENCOUNTER — Ambulatory Visit (HOSPITAL_COMMUNITY)
Admission: RE | Admit: 2011-07-22 | Discharge: 2011-07-22 | Disposition: A | Discharge status: Home / Self Care | Payer: BC Managed Care – PPO | Source: Ambulatory Visit | Attending: Orthopedic Surgery | Admitting: Orthopedic Surgery

## 2011-07-22 DIAGNOSIS — M6281 Muscle weakness (generalized): Secondary | ICD-10-CM | POA: Insufficient documentation

## 2011-07-22 DIAGNOSIS — M25569 Pain in unspecified knee: Secondary | ICD-10-CM | POA: Insufficient documentation

## 2011-07-22 DIAGNOSIS — M25669 Stiffness of unspecified knee, not elsewhere classified: Secondary | ICD-10-CM | POA: Insufficient documentation

## 2011-07-22 DIAGNOSIS — M25659 Stiffness of unspecified hip, not elsewhere classified: Secondary | ICD-10-CM | POA: Insufficient documentation

## 2011-07-22 DIAGNOSIS — IMO0001 Reserved for inherently not codable concepts without codable children: Secondary | ICD-10-CM | POA: Insufficient documentation

## 2011-07-22 DIAGNOSIS — I10 Essential (primary) hypertension: Secondary | ICD-10-CM | POA: Insufficient documentation

## 2011-07-22 DIAGNOSIS — R262 Difficulty in walking, not elsewhere classified: Secondary | ICD-10-CM | POA: Insufficient documentation

## 2011-07-22 NOTE — Progress Notes (Signed)
Physical Therapy Evaluation  Patient Name: [name] Date: 07/22/2011 HPI:S/P R TKR  Visit 1/1 charge eval, ice pack. Symptoms/Limitations Symptoms: Ms Hope states that her knee had been bothering her for some time, therefore, she opted to have a right total knee replacement on 06/17/2011.  Pt was hospitalized for three days and then went to SNF for 5 days mainly due to the fact that she was having difficulty with low blood pressure.  The patient sarted home health and was discharged  on 7/27.   Marland Kitchen How long can you sit comfortably?: no problems How long can you stand comfortably?: Patient is able to stand less than 10 minutes but this is secondary to back issues. How long can you walk comfortably?: The patient is walking with a cane for 15 minutes. Repetition: Increases Symptoms Pain Assessment Currently in Pain?: Yes Pain Score:   4 Pain Location: Knee Pain Orientation: Right Pain Type: Surgical pain Pain Onset: More than a month ago Pain Relieving Factors: ice Effect of Pain on Daily Activities: slightly increases. Multiple Pain Sites: No Past Medical History:  Past Medical History  Diagnosis Date  . Atrial fibrillation   . Degenerative joint disease   . Obesity   . Vertigo   . Sleep apnea     treated with CPAP  . Hypertension   . Fibromyalgia   . PUD (peptic ulcer disease)   . Hyperplastic colon polyp   . Diverticulosis   . Anxiety disorder    Past Surgical History:  Past Surgical History  Procedure Date  . Appendectomy 1968  . Bilroth i procedure 1970s; 2002    s/p Bilroth 1 1970 for peptic ulcer disease;  revision in 2002  . Cholecystectomy 1972  . Laparoscopic tubal ligation   . Total abdominal hysterectomy   . Cataract extraction   . Colonoscopy 08/2010    Precautions/Restrictions     Prior Functioning  Home Living Type of Home: House Lives With: Spouse Home Layout: Two level Alternate Level Stairs-Rails: Can reach both Alternate Level  Stairs-Number of Steps: 14 Bathroom Shower/Tub: Tub/shower unit Prior Function Level of Independence: Independent with basic ADLs Driving: No Able to Take Stairs Reciprically: No Vocation: Retired Leisure: Hobbies-yes (Comment) Comments: read, crossword, fishing...has not been fishing in a year.  Cognition Cognition Overall Cognitive Status: Appears within functional limits for tasks assessed Arousal/Alertness: Awake/alert Orientation Level: Oriented X4  Sensation/Coordination/Flexibility    Assessment RLE AROM (degrees) Right Knee Extension 0-130: 22  Right Knee Flexion 0-140: 107  RLE Strength Right Hip Flexion: 5/5 Right Hip Extension: 4/5 Right Hip ABduction: 4/5 Right Hip ADduction: 3+/5 Right Knee Flexion: 5/5 Right Knee Extension: 3+/5  Mobility (including Balance)       Exercise/Treatments Lumbar Stretches Active Hamstring Stretch: 30 seconds;3 reps Passive Hamstring Stretch: 3 reps;30 seconds (passive knee extension/flexion ) Hip Stretches Active Hamstring Stretch: 30 seconds;3 reps Passive Hamstring Stretch: 3 reps;30 seconds (passive knee extension/flexion ) Hip Exercises Heel Slides: 10 reps Bridges: Both;Strengthening;Supine;10 reps Knee Stretches Active Hamstring Stretch: 30 seconds;3 reps Passive Hamstring Stretch: 3 reps;30 seconds (passive knee extension/flexion ) Knee Exercises Quad Sets: 10 reps Heel Slides: 10 reps Hip ABduction: 10 reps;Sidelying;Right Bridges: Both;Strengthening;Supine;10 reps Modalities Modalities: Cryotherapy Cryotherapy Cryotherapy Location: Knee Type of Cryotherapy: Ice pack  Goals PT Short Term Goals Short Term Goal 1: I HEP Short Term Goal 2: Pt ROM to be 15 to 110 to allow more normalize walking. Short Term Goal 3: Pt pain level to be no greater  than 2/10 PT Long Term Goals Long Term Goal 1: Pt to be I in advance HEP Long Term Goal 2: Patient to be able to ambulate with a cane for 40 minutes with her  cane. Long Term Goal 3: Patient ROM to be 5 -120 to allow normalize gait and to allow patient to squat to pick items off of the ground.   End of Session Patient Active Problem List  Diagnoses  . HYPERLIPIDEMIA  . OBESITY  . Pica in adults  . HYPERTENSION  . Hypertensive heart disease  . FIBRILLATION, ATRIAL  . Peptic ulcer disease  . DIVERTICULOSIS-COLON  . DEGENERATIVE JOINT DISEASE  . FIBROMYALGIA  . Obstructive sleep apnea  . DYSPNEA  . COLONIC POLYPS, HX OF  . Chronic anticoagulation  . Difficulty in walking  . Muscle weakness (generalized)  . Pain in joint, lower leg  . Stiffness of joint, not elsewhere classified, lower leg   PT - End of Session Activity Tolerance: Patient tolerated treatment well General Behavior During Session: [facility] for tasks performed Cognition: [facility] for tasks performed PT Assessment and Plan Clinical Impression Statement: Patient with decreased ROM, decreased strength, knee pain and difficuly walking who will benefit from skilled physical therapy to maximize functional potential. Rehab Potential: Good PT Frequency: Min 3X/week PT Duration: 4 weeks PT Treatment/Interventions: Gait training;Therapeutic exercise;Other (comment) (IP) PT Plan: See pt three times a week for four weeks for total knee rehab.  Add standing knee flexion, Terminal knee extension functional squats,and bikes.  Time Calculation Start Time: 1545 Stop Time: 1634 Time Calculation (min): 49 min  RUSSELL,CINDY 07/22/2011, 4:38 PM

## 2011-07-22 NOTE — Patient Instructions (Addendum)
HEP

## 2011-07-24 ENCOUNTER — Ambulatory Visit (HOSPITAL_COMMUNITY)
Admission: RE | Admit: 2011-07-24 | Discharge: 2011-07-24 | Disposition: A | Discharge status: Home / Self Care | Payer: BC Managed Care – PPO | Source: Ambulatory Visit | Attending: Orthopedic Surgery | Admitting: Orthopedic Surgery

## 2011-07-24 DIAGNOSIS — M25659 Stiffness of unspecified hip, not elsewhere classified: Secondary | ICD-10-CM | POA: Insufficient documentation

## 2011-07-24 DIAGNOSIS — I10 Essential (primary) hypertension: Secondary | ICD-10-CM | POA: Insufficient documentation

## 2011-07-24 DIAGNOSIS — M25569 Pain in unspecified knee: Secondary | ICD-10-CM | POA: Insufficient documentation

## 2011-07-24 DIAGNOSIS — IMO0001 Reserved for inherently not codable concepts without codable children: Secondary | ICD-10-CM | POA: Insufficient documentation

## 2011-07-24 DIAGNOSIS — R262 Difficulty in walking, not elsewhere classified: Secondary | ICD-10-CM | POA: Insufficient documentation

## 2011-07-24 DIAGNOSIS — M6281 Muscle weakness (generalized): Secondary | ICD-10-CM | POA: Insufficient documentation

## 2011-07-24 NOTE — Progress Notes (Signed)
Physical Therapy Treatment Patient Name: [name] Date: 07/24/2011  Visit #:2 of 12 (2/2) Time in: 4:04 Time out: 5:00 Initial Evaluation Date: 07/22/11 Charges: Therex 35'  Manual therapy x 8'    HPI:  Warm up: Stationary Bike: 6'@1 .0  Standing: Knee Flexion: 10 reps;Right;Standing Functional Squat: 10 reps TKE w/ blue band 2X5"  Seated: Long Arc Quad: 10 reps;Seated  Supine: Quad Sets: 10 reps Bridges: Both;Strengthening;Supine;10 reps Straight Leg Raises: 10 reps;Right Heel Slides: 10 reps Active Hamstring Stretch: 30 seconds;3 reps Passive Hamstring Stretch: 3 reps;30 seconds (passive knee extension/flexion)  Side lying: Hip ABduction: 10 reps;Sidelying;Right    Symptoms/Limitations Symptoms: Not really hurting too bad today. It's more stiffness and soreness. Pain Assessment Currently in Pain?: Yes Pain Score:   2 Pain Location: Knee Pain Orientation: Right Pain Type: Surgical pain Pain Onset: More than a month ago   Exercise/Treatments Modalities Modalities: Cryotherapy Manual Therapy Manual Therapy: Other (comment) Other Manual Therapy: PROM to R knee to increase flex/ext x8' Cryotherapy Number Minutes Cryotherapy: 5 Minutes (pt was numb after 5') Cryotherapy Location: Knee (Right) Type of Cryotherapy: Ice pack  Goals PT Short Term Goals Short Term Goal 1: I HEP Short Term Goal 1 Progress: Progressing toward goal Short Term Goal 2: Pt ROM to be 15 to 110 to allow more normalize walking. Short Term Goal 2 Progress: Progressing toward goal Short Term Goal 3: Pt pain level to be no greater than 2/10 Short Term Goal 3 Progress: Progressing toward goal PT Long Term Goals Long Term Goal 1: Pt to be I in advance HEP Long Term Goal 1 Progress: Progressing toward goal Long Term Goal 2: Patient to be able to ambulate with a cane for 40 minutes with her cane. Long Term Goal 2 Progress: Progressing toward goal Long Term Goal 3: Patient ROM  to be 5 -120 to allow normalize gait and to allow patient to squat to pick items off of the ground.   Long Term Goal 3 Progress: Progressing toward goal End of Session Patient Active Problem List  Diagnoses  . HYPERLIPIDEMIA  . OBESITY  . Pica in adults  . HYPERTENSION  . Hypertensive heart disease  . FIBRILLATION, ATRIAL  . Peptic ulcer disease  . DIVERTICULOSIS-COLON  . DEGENERATIVE JOINT DISEASE  . FIBROMYALGIA  . Obstructive sleep apnea  . DYSPNEA  . COLONIC POLYPS, HX OF  . Chronic anticoagulation  . Difficulty in walking  . Muscle weakness (generalized)  . Pain in joint, lower leg  . Stiffness of joint, not elsewhere classified, lower leg   PT - End of Session Activity Tolerance: Patient tolerated treatment well General Behavior During Session: [facility] for tasks performed Cognition: [facility] for tasks performed PT Assessment and Plan Clinical Impression Statement: Pt completes therex with minimal need for cueing. Pt very sensitive to PROM.  PT Treatment/Interventions: Therapeutic exercise;Other (comment) (PROM x 8'; Ice pack x 5') PT Plan: Continue to progress strength/ROM.  Seth Bake [facility] 07/24/2011, 5:18 PM

## 2011-07-29 ENCOUNTER — Ambulatory Visit (HOSPITAL_COMMUNITY)
Admission: RE | Admit: 2011-07-29 | Discharge: 2011-07-29 | Disposition: A | Discharge status: Home / Self Care | Payer: BC Managed Care – PPO | Source: Ambulatory Visit | Attending: Family Medicine | Admitting: Family Medicine

## 2011-07-29 NOTE — Progress Notes (Signed)
Physical Therapy Treatment Patient Name: [name] Date: 07/29/2011 HPI: R TKR   Symptoms/Limitations Symptoms: Pt states knee is more sore than pain.  L knee seems weaker than the R now. Pain Assessment Currently in Pain?: No/denies  Precautions/Restrictions     Mobility (including Balance)       Exercise/Treatments Knee Stretches Active Hamstring Stretch: 3 reps;30 seconds Passive Hamstring Stretch: 3 reps;30 seconds Knee Exercises Quad Sets: 15 reps Knee Flexion: Strengthening;Right;Standing (3#) Knee Extension:  (standing TKE w/t-band 15) Long Arc Quad: Strengthening;Right;10 reps;Weights (3#) Heel Slides: 15 reps Hip ABduction: 15 reps Bridges: 15 reps Additional Knee Exercises Rocker Board: 1 minute Additional Ankle Exercises Rocker Board: 1 minute Balance Exercises Stationary Bike: 6'@ 1/0 Modalities Modalities: Cryotherapy Cryotherapy Number Minutes Cryotherapy: 10 Minutes Cryotherapy Location: Knee Type of Cryotherapy: Ice pack  Goals   End of Session Patient Active Problem List  Diagnoses  . HYPERLIPIDEMIA  . OBESITY  . Pica in adults  . HYPERTENSION  . Hypertensive heart disease  . FIBRILLATION, ATRIAL  . Peptic ulcer disease  . DIVERTICULOSIS-COLON  . DEGENERATIVE JOINT DISEASE  . FIBROMYALGIA  . Obstructive sleep apnea  . DYSPNEA  . COLONIC POLYPS, HX OF  . Chronic anticoagulation  . Difficulty in walking  . Muscle weakness (generalized)  . Pain in joint, lower leg  . Stiffness of joint, not elsewhere classified, lower leg   PT - End of Session Activity Tolerance: Patient tolerated treatment well General Behavior During Session: [facility] for tasks performed Cognition: [facility] for tasks performed PT Assessment and Plan Clinical Impression Statement: Pt with improved ambulation ability.  ROM is slowly progressing. Rehab Potential: Good PT Frequency: Min 3X/week PT Duration: 4 weeks PT Plan: begin gt without  cane.  Sumie Remsen,CINDY 07/29/2011, 4:49 PM

## 2011-07-31 ENCOUNTER — Ambulatory Visit (HOSPITAL_COMMUNITY): Payer: BC Managed Care – PPO | Admitting: Physical Therapy

## 2011-08-05 ENCOUNTER — Ambulatory Visit (HOSPITAL_COMMUNITY)
Admission: RE | Admit: 2011-08-05 | Discharge: 2011-08-05 | Disposition: A | Discharge status: Home / Self Care | Payer: BC Managed Care – PPO | Source: Ambulatory Visit | Attending: Family Medicine | Admitting: Family Medicine

## 2011-08-05 NOTE — Patient Instructions (Signed)
New HEP

## 2011-08-05 NOTE — Progress Notes (Signed)
Physical Therapy Treatment Patient Name: [name] Date: 08/05/2011  HPI:R TKR                                                                                                                                                                                                                     Symptoms/Limitations Symptoms: more soreness than pain Pain Assessment Currently in Pain?: No/denies  Precautions/Restrictions     Mobility (including Balance)       Exercise/Treatments Rocker Board: 2 minutes Stationary Bike: 7'@3 .0 Knee Stretches Active Hamstring Stretch: 3 reps;30 seconds Passive Hamstring Stretch: 3 reps;30 seconds Knee Exercises Quad Sets: 15 reps Knee Flexion: Strengthening;Right;Standing;15 reps (3#) Knee Extension:  (standing TKE w/t-band 15) Long Arc Quad: Strengthening;Right;Weights;15 reps (5#) Heel Slides: 15 reps Hip Extension: Right;10 reps (prone) Hamstring Curl:  (Prone knee flex for 3#) Hip ABduction: 15 reps;Sidelying (3#) Hip ADduction: 10 reps;Strengthening;Right;Sidelying Bridges: 15 reps Additional Knee Exercises Lateral Step Up: 15 reps;Step Height: 4" Forward Step Up: Step Height: 4";15 reps Rocker Board: 2 minutes  Goals   End of Session Patient Active Problem List  Diagnoses  . HYPERLIPIDEMIA  . OBESITY  . Pica in adults  . HYPERTENSION  . Hypertensive heart disease  . FIBRILLATION, ATRIAL  . Peptic ulcer disease  . DIVERTICULOSIS-COLON  . DEGENERATIVE JOINT DISEASE  . FIBROMYALGIA  . Obstructive sleep apnea  . DYSPNEA  . COLONIC POLYPS, HX OF  . Chronic anticoagulation  . Difficulty in walking  . Muscle weakness (generalized)  . Pain in joint, lower leg  . Stiffness of joint, not elsewhere classified, lower leg   PT - End of Session Activity Tolerance: Patient tolerated treatment well General Behavior During Session: [facility] for tasks performed Cognition: [facility] for tasks performed PT Assessment and Plan Clinical  Impression Statement: Pt improving well with ROM Rehab Potential: Good PT Frequency: Min 3X/week PT Duration: 4 weeks PT Treatment/Interventions: Therapeutic exercise PT Plan: Beging sls and prone knee hang next rx.  RUSSELL,CINDY 08/05/2011, 4:36 PM

## 2011-08-08 ENCOUNTER — Ambulatory Visit (HOSPITAL_COMMUNITY)
Admission: RE | Admit: 2011-08-08 | Discharge: 2011-08-08 | Disposition: A | Discharge status: Home / Self Care | Payer: BC Managed Care – PPO | Source: Ambulatory Visit | Attending: Family Medicine | Admitting: Family Medicine

## 2011-08-08 NOTE — Progress Notes (Signed)
Physical Therapy Treatment Patient Details  Name: [name] MRN: [id number] Date of Birth: [birth year]  Today's Date: 08/08/2011 Time: 4098-1191 Time Calculation (min): 59 min Visit#: 5 of 12 Re-eval: 08/22/11 Charges:  Therex 38', Manual 8', Ice 10'  Subjective: Symptoms/Limitations Symptoms: Pt reports she has been sore after last session and adding those weights.  States she had to use a scooter to complete her grocery shopping. Pain Assessment Currently in Pain?: Yes Pain Score:   6 Pain Location: Knee Pain Orientation: Right Pain Relieving Factors: ice, elevation, rest Multiple Pain Sites: No  Precautions/Restrictions :  None noted        Exercise/Treatments Warm up:  Stationary Bike: 7'@2 .0, seat on 10  Standing:  Knee Flexion: 3#15 reps  Functional Squat: 15 reps  TKE w/ blue band 2X5"  Rockerboard 1 minutes each way Forward step ups 4" 10X Lateral Step ups 4" 10X Seated:  Long Arc Quad: 5#15 reps  Supine:  Bridges: 15 reps  Straight Leg Raises: 15 reps  Heel Slides: 10 reps  Active Hamstring Stretch: 30 seconds;3 reps  PROM  Side lying:  Hip ABduction: 3# 15 reps Hip Adduction 15X  Prone: Hip Extension: 15 reps  Hamstring Curl: 3# 15X    Modalities Modalities: Cryotherapy Manual Therapy Manual Therapy: Other (comment) Other Manual Therapy: PROM to R knee to increase flexion and extension Cryotherapy Number Minutes Cryotherapy: 10 Minutes Cryotherapy Location: Knee Type of Cryotherapy: Ice pack  Physical Therapy Assessment and Plan PT Assessment and Plan Clinical Impression Statement: Treatment same as last visit with no increase in reps/weights or new therex secondary to increased pain/soreness.  Add SLS, forward step downs and prone knee hangs next visit if no increased pain. PT Treatment/Interventions: Therapeutic exercise;Other (comment) (PROM and ice to Right knee) PT Plan: Continue per POC.  Add SLS, prone knee hangs and forward step  downs if pain is down next visit.       Problem List Patient Active Problem List  Diagnoses  . HYPERLIPIDEMIA  . OBESITY  . Pica in adults  . HYPERTENSION  . Hypertensive heart disease  . FIBRILLATION, ATRIAL  . Peptic ulcer disease  . DIVERTICULOSIS-COLON  . DEGENERATIVE JOINT DISEASE  . FIBROMYALGIA  . Obstructive sleep apnea  . DYSPNEA  . COLONIC POLYPS, HX OF  . Chronic anticoagulation  . Difficulty in walking  . Muscle weakness (generalized)  . Pain in joint, lower leg  . Stiffness of joint, not elsewhere classified, lower leg    PT - End of Session Activity Tolerance: Patient tolerated treatment well General Behavior During Session: [facility] for tasks performed Cognition: [facility] for tasks performed  Emeline Gins B 08/08/2011, 5:04 PM

## 2011-08-09 ENCOUNTER — Ambulatory Visit (HOSPITAL_COMMUNITY): Payer: BC Managed Care – PPO | Admitting: *Deleted

## 2011-08-09 ENCOUNTER — Telehealth (HOSPITAL_COMMUNITY): Payer: Self-pay | Admitting: *Deleted

## 2011-08-12 ENCOUNTER — Ambulatory Visit (HOSPITAL_COMMUNITY): Payer: BC Managed Care – PPO | Admitting: Physical Therapy

## 2011-08-14 ENCOUNTER — Ambulatory Visit (HOSPITAL_COMMUNITY): Payer: BC Managed Care – PPO | Admitting: Physical Therapy

## 2011-08-16 ENCOUNTER — Ambulatory Visit (HOSPITAL_COMMUNITY): Payer: BC Managed Care – PPO | Admitting: Physical Therapy

## 2011-08-19 ENCOUNTER — Ambulatory Visit (HOSPITAL_COMMUNITY)
Admission: RE | Admit: 2011-08-19 | Discharge: 2011-08-19 | Disposition: A | Discharge status: Home / Self Care | Payer: BC Managed Care – PPO | Source: Ambulatory Visit | Attending: Family Medicine | Admitting: Family Medicine

## 2011-08-19 NOTE — Progress Notes (Signed)
Physical Therapy Treatment Patient Details  Name: [name] MRN: [id number] Date of Birth: [birth year]  Today's Date: 08/19/2011 Time: 7829-5621 Time Calculation (min): 66 min Visit#: 6 of 12 Re-eval: 08/22/11 Charges: Therex x 34' Manual x 14'   Subjective: Symptoms/Limitations Symptoms: No pain, just a little sore from sitting. I did a lot of walking on vacation and it feels stronger. Pain Assessment Currently in Pain?: No/denies   Exercise/Treatments Warm up:  Stationary Bike: 6'@3 .0 Standing:  Knee Flexion: 3#15 reps  Functional Squat: 15 reps  TKE w/ blue band 15X5"  Rockerboard 2 minutes each way  Forward step ups 4" 10X  Lateral Step ups 4" 10X  Seated:  Long Arc Quad: 5#15 reps  Supine:  Bridges: 15 reps  Straight Leg Raises: 15 reps  Heel Slides: 10 reps  PROM   Manual Therapy Manual Therapy: Other (comment) Other Manual Therapy: Retro massage to decrease swelling x 10' PROM to increase flex/ext x 4'  Physical Therapy Assessment and Plan PT Assessment and Plan Clinical Impression Statement: Pt with increased R knee flexion but continues to have decreased ext. Pt does not tolerate PROM for R knee ext well.  PT Plan: Reassess next tx. Begin prone knee hangs next tx.     Problem List Patient Active Problem List  Diagnoses  . HYPERLIPIDEMIA  . OBESITY  . Pica in adults  . HYPERTENSION  . Hypertensive heart disease  . FIBRILLATION, ATRIAL  . Peptic ulcer disease  . DIVERTICULOSIS-COLON  . DEGENERATIVE JOINT DISEASE  . FIBROMYALGIA  . Obstructive sleep apnea  . DYSPNEA  . COLONIC POLYPS, HX OF  . Chronic anticoagulation  . Difficulty in walking  . Muscle weakness (generalized)  . Pain in joint, lower leg  . Stiffness of joint, not elsewhere classified, lower leg    PT - End of Session Activity Tolerance: Patient tolerated treatment well General Behavior During Session: [facility] for tasks performed Cognition: [facility] for tasks  performed  Antonieta Iba 08/19/2011, 5:24 PM

## 2011-08-21 ENCOUNTER — Ambulatory Visit (HOSPITAL_COMMUNITY)
Admission: RE | Admit: 2011-08-21 | Discharge: 2011-08-21 | Disposition: A | Discharge status: Home / Self Care | Payer: BC Managed Care – PPO | Source: Ambulatory Visit | Attending: Family Medicine | Admitting: Family Medicine

## 2011-08-21 DIAGNOSIS — M25569 Pain in unspecified knee: Secondary | ICD-10-CM

## 2011-08-21 DIAGNOSIS — M6281 Muscle weakness (generalized): Secondary | ICD-10-CM

## 2011-08-21 DIAGNOSIS — M25669 Stiffness of unspecified knee, not elsewhere classified: Secondary | ICD-10-CM

## 2011-08-21 DIAGNOSIS — R262 Difficulty in walking, not elsewhere classified: Secondary | ICD-10-CM

## 2011-08-21 NOTE — Patient Instructions (Addendum)
Prone knee hangs

## 2011-08-21 NOTE — Progress Notes (Signed)
Physical Therapy Evaluation  Patient Details  Name: [name] MRN: [id number] Date of Birth: 18-Aug-1951  Today's Date: 08/21/2011 Time: 4098-1191 Time Calculation (min): 38 min Visit#: 7 of 12 (pt on vacation) Re-eval: 08/30/11  Past Medical History:  Past Medical History  Diagnosis Date  . Atrial fibrillation   . Degenerative joint disease   . Obesity   . Vertigo   . Sleep apnea     treated with CPAP  . Hypertension   . Fibromyalgia   . PUD (peptic ulcer disease)   . Hyperplastic colon polyp   . Diverticulosis   . Anxiety disorder    Past Surgical History:  Past Surgical History  Procedure Date  . Appendectomy 1968  . Bilroth i procedure 1970s; 2002    s/p Bilroth 1 1970 for peptic ulcer disease;  revision in 2002  . Cholecystectomy 1972  . Laparoscopic tubal ligation   . Total abdominal hysterectomy   . Cataract extraction   . Colonoscopy 08/2010    Subjective Symptoms/Limitations Symptoms: The patient states that her whole leg is sore.  She states that her leg has nevere been straight since she had ricketts.  The patient called her MD and he stated not to do the passive  extension until  the MD sees her.   How long can you sit comfortably?: no problem. How long can you stand comfortably?: Pt states that she is able to stand as long as she want to at eval she was able to stand for 10 minutes. How long can you walk comfortably?: Pt is currently able to ambulate with a cane close to an hour at eval this was 15 minutes.  Pain Assessment Currently in Pain?: Yes Pain Score:   2 Pain Location: Leg Pain Orientation: Right Pain Radiating Towards: Pt states that the whole leg is sore.  Precautions/Restrictions   none  Prior Functioning   I  Cognition  wnl  Sensation/Coordination/Flexibility  tight Ham  Assessment RLE AROM (degrees) Right Knee Extension 0-130: 17  Was 22 Right Knee Flexion 0-140: 114 was 107 RLE Strength Right Hip Flexion: 5/5  Right  Hip Extension: 4/5 was 4/5 Right Hip ABduction: 5/5 was 4/5 Right Hip ADduction: 5/5 was 3+/5 Right Knee Flexion: 5/5 Right Knee Extension: 5/5 was 3+/5  Mobility (including Balance)   Ambulation with cane.    Exercise/Treatments   For increased ROM     Physical Therapy Assessment and Plan PT Assessment and Plan Clinical Impression Statement: Pt has made good gains in strength all are wnl except for hip extension.  Pt needs increased ROM no PROM for extension per MD request. Rehab Potential: Good PT Frequency: Min 2X/week PT Duration:  (3 weeks) PT Plan: work on ROM exercises including terminal extesion, prone knee hang ,heelslides, ham stretch...(exception PROM for extesion), work on glut strength sit to stand, prone extension....  Pt may benefit from massage to hamstrings while completing prone knee hang.  Goals PT Short Term Goals PT Short Term Goal 1 - Progress: Met PT Short Term Goal 2: Pt has 17 to 114 PT Short Term Goal 2 - Progress: Progressing toward goal PT Short Term Goal 3 - Progress: Met PT Long Term Goals PT Long Term Goal 1 - Progress: Met PT Long Term Goal 2 - Progress: Met Long Term Goal 3 Progress: Progressing toward goal  Problem List Patient Active Problem List  Diagnoses  . HYPERLIPIDEMIA  . OBESITY  . Pica in adults  . HYPERTENSION  . Hypertensive  heart disease  . FIBRILLATION, ATRIAL  . Peptic ulcer disease  . DIVERTICULOSIS-COLON  . DEGENERATIVE JOINT DISEASE  . FIBROMYALGIA  . Obstructive sleep apnea  . DYSPNEA  . COLONIC POLYPS, HX OF  . Chronic anticoagulation  . Difficulty in walking  . Muscle weakness (generalized)  . Pain in joint, lower leg  . Stiffness of joint, not elsewhere classified, lower leg    PT - End of Session Activity Tolerance: Patient tolerated treatment well General Behavior During Session: [facility] for tasks performed Cognition: [facility] for tasks performed   RUSSELL,CINDY 08/21/2011, 4:56 PM  Physician  Documentation Your signature is required to indicate approval of the treatment plan as stated above.  Please sign and either send electronically or make a copy of this report for your files and return this physician signed original.   Please mark one 1.__approve of plan  2. ___approve of plan with the following conditions.   ______________________________                                                          _____________________ Physician Signature                                                                                                             Date

## 2011-08-23 ENCOUNTER — Ambulatory Visit (HOSPITAL_COMMUNITY): Payer: BC Managed Care – PPO | Admitting: Physical Therapy

## 2011-08-28 ENCOUNTER — Ambulatory Visit (HOSPITAL_COMMUNITY)
Admission: RE | Admit: 2011-08-28 | Discharge: 2011-08-28 | Disposition: A | Discharge status: Home / Self Care | Payer: BC Managed Care – PPO | Source: Ambulatory Visit | Attending: Orthopedic Surgery | Admitting: Orthopedic Surgery

## 2011-08-28 DIAGNOSIS — M6281 Muscle weakness (generalized): Secondary | ICD-10-CM | POA: Insufficient documentation

## 2011-08-28 DIAGNOSIS — M25659 Stiffness of unspecified hip, not elsewhere classified: Secondary | ICD-10-CM | POA: Insufficient documentation

## 2011-08-28 DIAGNOSIS — R262 Difficulty in walking, not elsewhere classified: Secondary | ICD-10-CM | POA: Insufficient documentation

## 2011-08-28 DIAGNOSIS — M25569 Pain in unspecified knee: Secondary | ICD-10-CM | POA: Insufficient documentation

## 2011-08-28 DIAGNOSIS — IMO0001 Reserved for inherently not codable concepts without codable children: Secondary | ICD-10-CM | POA: Insufficient documentation

## 2011-08-28 DIAGNOSIS — I10 Essential (primary) hypertension: Secondary | ICD-10-CM | POA: Insufficient documentation

## 2011-08-28 NOTE — Progress Notes (Signed)
Physical Therapy Treatment Patient Details  Name: [name] MRN: [id number] Date of Birth: [birth year]  Today's Date: 08/28/2011 Time: 7829-5621 Pt started on red bike by Becky Sax, PTA Time Calculation (min): 65 min Visit#: 1of 6 (visit # 8 to date) Re-eval: 08/30/11 Charges: Manual therapy x 8'  Therex x 31' Ice x 1 unit  Subjective: Symptoms/Limitations Symptoms: No pain. I only have pain occasionally. Pain Assessment Currently in Pain?: No/denies  Exercise/Treatments Warm up:  Stationary Bike: 8'@3 .0  Standing:  Retro gait 2RT TKE w/ blue band 15X5"  Seated:  Sit to stand 2x5 Supine:  Active HS stretch 4x30" TKE 10x5" w/ towel  Heel Slides: 10 reps  Prone  Hip ext x10 Knee hang with STM 5'  Modalities Modalities: Cryotherapy Manual Therapy Other Manual Therapy: STM/ MFR to B hamstring 5' w/prone knee hang; 3' in prone with lower leg supported Cryotherapy Number Minutes Cryotherapy: 10 Minutes Cryotherapy Location: Knee;Other (comment) (Right) Type of Cryotherapy: Ice pack  Physical Therapy Assessment and Plan PT Assessment and Plan Clinical Impression Statement: Began hip ext strengthening therex with some difficulty secondary to weakness. Pt able to tolerate 5' of prone knee hang. PT Treatment/Interventions: Therapeutic exercise;Other (comment) (Manual therapy; Ice) PT Plan: Continue to progress per PT POC.     Problem List Patient Active Problem List  Diagnoses  . HYPERLIPIDEMIA  . OBESITY  . Pica in adults  . HYPERTENSION  . Hypertensive heart disease  . FIBRILLATION, ATRIAL  . Peptic ulcer disease  . DIVERTICULOSIS-COLON  . DEGENERATIVE JOINT DISEASE  . FIBROMYALGIA  . Obstructive sleep apnea  . DYSPNEA  . COLONIC POLYPS, HX OF  . Chronic anticoagulation  . Difficulty in walking  . Muscle weakness (generalized)  . Pain in joint, lower leg  . Stiffness of joint, not elsewhere classified, lower leg    PT - End of Session Activity  Tolerance: Patient tolerated treatment well General Behavior During Session: [facility] for tasks performed Cognition: [facility] for tasks performed  Antonieta Iba 08/28/2011, 5:24 PM

## 2011-08-30 ENCOUNTER — Ambulatory Visit (HOSPITAL_COMMUNITY): Payer: BC Managed Care – PPO | Admitting: Physical Therapy

## 2011-08-30 ENCOUNTER — Telehealth (HOSPITAL_COMMUNITY): Payer: Self-pay | Admitting: Physical Therapy

## 2011-11-01 ENCOUNTER — Ambulatory Visit (INDEPENDENT_AMBULATORY_CARE_PROVIDER_SITE_OTHER): Payer: BC Managed Care – PPO | Admitting: Adult Health

## 2011-11-01 ENCOUNTER — Encounter: Payer: Self-pay | Admitting: Adult Health

## 2011-11-01 VITALS — BP 124/77 | HR 59 | Ht 64.0 in | Wt 237.0 lb

## 2011-11-01 DIAGNOSIS — I4891 Unspecified atrial fibrillation: Secondary | ICD-10-CM

## 2011-11-01 NOTE — Progress Notes (Signed)
HPI: Mrs. Opfer is a 60 y/o patient of Dr. Dietrich Pates, we are following for ongoing assessment and treatment of PAF, history of sleep apnea, pulmonary edmea and hypertension. Since last visit in April of 2012 she has had a TKR on the right and completed rehab 2 weeks ago.  She is without complaint of chest pain or shortness of breath.  She states that when she drinks too much coffee when it is cold, she notices palpitations, and cuts down on it.  She continues to try to lose weight, but surgery has limited her exercise routine. She is back on low level exercise and wants to lose 37 more pounds at the very least..  Allergies  Allergen Reactions  . Codeine   . Penicillins   . Sulfonamide Derivatives     REACTION: itching, GI upset    Current Outpatient Prescriptions  Medication Sig Dispense Refill  . ALPRAZolam (XANAX) 0.5 MG tablet Take 0.5 mg by mouth at bedtime as needed.        . celecoxib (CELEBREX) 200 MG capsule Take 200 mg by mouth daily.        . chlorthalidone (HYGROTON) 25 MG tablet Take 0.5 tablets (12.5 mg total) by mouth daily.  15 tablet  3  . co-enzyme Q-10 30 MG capsule Take 30 mg by mouth daily.        . diphenhydrAMINE (BENADRYL) 25 MG tablet Take 25 mg by mouth every 6 (six) hours as needed.        . fentaNYL (DURAGESIC - DOSED MCG/HR) 50 MCG/HR Place 1 patch onto the skin every 3 (three) days.        . fish oil-omega-3 fatty acids 1000 MG capsule Take 2 g by mouth daily.        Marland Kitchen FLUoxetine (PROZAC) 40 MG capsule Take 40 mg by mouth daily.        Marland Kitchen glycopyrrolate (ROBINUL) 2 MG tablet Take 2 mg by mouth as needed.       . hydrocodone-acetaminophen (LORCET-HD) 5-500 MG per capsule Take 1 capsule by mouth every 6 (six) hours as needed.        . hyoscyamine (NULEV) 0.125 MG TBDP Place 0.125 mg under the tongue every 4 (four) hours.        . metoCLOPramide (REGLAN) 5 MG tablet Take 5 mg by mouth daily.        Marland Kitchen olmesartan (BENICAR) 40 MG tablet Take 40 mg by mouth daily. Take  1/2 tab daily      . omeprazole (PRILOSEC) 40 MG capsule Take 40 mg by mouth daily.        Marland Kitchen tiZANidine (ZANAFLEX) 2 MG tablet Take 2 mg by mouth every 6 (six) hours as needed. 2 tabs daily         Past Medical History  Diagnosis Date  . Atrial fibrillation   . Degenerative joint disease   . Obesity   . Vertigo   . Sleep apnea     treated with CPAP  . Hypertension   . Fibromyalgia   . PUD (peptic ulcer disease)   . Hyperplastic colon polyp   . Diverticulosis   . Anxiety disorder     Past Surgical History  Procedure Date  . Appendectomy 1968  . Bilroth i procedure 1970s; 2002    s/p Bilroth 1 1970 for peptic ulcer disease;  revision in 2002  . Cholecystectomy 1972  . Laparoscopic tubal ligation   . Total abdominal hysterectomy   . Cataract extraction   .  Colonoscopy 08/2010  . Total knee arthroplasty     Right knee in June 2012    BJY:NWGNFA of systems complete and found to be negative unless listed above PHYSICAL EXAM BP 124/77  Pulse 59  Ht 5\' 4"  (1.626 m)  Wt 237 lb (107.502 kg)  BMI 40.68 kg/m2  General: Well developed, well nourished, in no acute distress Head: Eyes PERRLA, No xanthomas.   Normal cephalic and atramatic  Lungs: Clear bilaterally to auscultation and percussion. Heart: HRRR S1 S2, without MRG.  Pulses are 2+ & equal.            No carotid bruit. No JVD.  No abdominal bruits. No femoral bruits. Abdomen: Bowel sounds are positive, abdomen soft and non-tender without masses or                  Hernia's noted. Msk:  Back normal, normal gait. Normal strength and tone for age. Extremities: No clubbing, cyanosis or edema.  DP +1 Neuro: Alert and oriented X 3. Psych:  Good affect, responds appropriately    ASSESSMENT AND PLAN

## 2011-11-01 NOTE — Assessment & Plan Note (Signed)
Paroxysmal atrial fibrillation is documented. She is no longer on anticoagulation.  She denies symptoms. HR is well controlled at present with no rate control medications.  Will continue to monitor her with no changes or additional medications. Will see her in 6 months unless symptomatic.

## 2011-11-01 NOTE — Patient Instructions (Signed)
Your physician recommends that you schedule a follow-up appointment in:  6 month follow up.  You will receive a reminder letter in the mail.

## 2012-03-16 ENCOUNTER — Ambulatory Visit (INDEPENDENT_AMBULATORY_CARE_PROVIDER_SITE_OTHER): Payer: BC Managed Care – PPO | Admitting: Adult Health

## 2012-03-16 ENCOUNTER — Encounter: Payer: Self-pay | Admitting: Adult Health

## 2012-03-16 VITALS — BP 148/98 | HR 64 | Resp 18 | Ht 64.0 in | Wt 239.0 lb

## 2012-03-16 DIAGNOSIS — I4891 Unspecified atrial fibrillation: Secondary | ICD-10-CM

## 2012-03-16 DIAGNOSIS — I4892 Unspecified atrial flutter: Secondary | ICD-10-CM

## 2012-03-16 DIAGNOSIS — I10 Essential (primary) hypertension: Secondary | ICD-10-CM

## 2012-03-16 NOTE — Assessment & Plan Note (Signed)
EKG today does not show evidence of atrial fibrillation. She is experiencing palpitations, and racing heart rate everyday. I will place a 48 hr.cardiac monitor to ascertain frequency and duration of her "surges" as she calls them.  She is due to have labs per Dr. Phillips Odor drawn today. Will get copies of this for our review. No medications will be prescribed at this time. Follow-up with Dr. Dietrich Pates next visit.

## 2012-03-16 NOTE — Progress Notes (Signed)
HPI: [name] is a 61 y/o patient of Dr. Dietrich Pates we are seeing for ongoing assessment and treatment of PAF,hypertension, with history of OSA on CPAP, and anxiety. She comes today after 6 months with recurrent complaints of palpitations, coming everyday with associated chest pressure, and shortness of breath. She states that she is under some stress with mother's illness.  However,she is now noticing that the palpitations are coming more regularly and often throughout the day. She is feeling more tired.  She is concerned that she will need to be put on coumadin. She is not currently on any antiarrythmic medications.  Allergies  Allergen Reactions  . Codeine   . Penicillins   . Sulfonamide Derivatives     REACTION: itching, GI upset    Current Outpatient Prescriptions  Medication Sig Dispense Refill  . ALPRAZolam (XANAX) 0.5 MG tablet Take 0.5 mg by mouth at bedtime as needed.        . celecoxib (CELEBREX) 200 MG capsule Take 200 mg by mouth daily.        . chlorthalidone (HYGROTON) 25 MG tablet Take 0.5 tablets (12.5 mg total) by mouth daily.  15 tablet  3  . co-enzyme Q-10 30 MG capsule Take 30 mg by mouth daily.        . diphenhydrAMINE (BENADRYL) 25 MG tablet Take 25 mg by mouth every 6 (six) hours as needed.        . fentaNYL (DURAGESIC - DOSED MCG/HR) 50 MCG/HR Place 1 patch onto the skin every 3 (three) days.        . fish oil-omega-3 fatty acids 1000 MG capsule Take 2 g by mouth daily.        Marland Kitchen FLUoxetine (PROZAC) 40 MG capsule Take 40 mg by mouth daily.        Marland Kitchen glycopyrrolate (ROBINUL) 2 MG tablet Take 2 mg by mouth as needed.       . hydrocodone-acetaminophen (LORCET-HD) 5-500 MG per capsule Take 1 capsule by mouth every 6 (six) hours as needed.        . hyoscyamine (NULEV) 0.125 MG TBDP Place 0.125 mg under the tongue every 4 (four) hours.        . metoCLOPramide (REGLAN) 5 MG tablet Take 5 mg by mouth as needed.       Marland Kitchen olmesartan (BENICAR) 40 MG tablet Take 40 mg by mouth  daily. Take 1/2 tab daily      . omeprazole (PRILOSEC) 40 MG capsule Take 40 mg by mouth daily.        Marland Kitchen tiZANidine (ZANAFLEX) 2 MG tablet Take 4 mg by mouth at bedtime and may repeat dose one time if needed.         Past Medical History  Diagnosis Date  . Atrial fibrillation   . Degenerative joint disease   . Obesity   . Vertigo   . Sleep apnea     treated with CPAP  . Hypertension   . Fibromyalgia   . PUD (peptic ulcer disease)   . Hyperplastic colon polyp   . Diverticulosis   . Anxiety disorder     Past Surgical History  Procedure Date  . Appendectomy 1968  . Bilroth i procedure 1970s; 2002    s/p Bilroth 1 1970 for peptic ulcer disease;  revision in 2002  . Cholecystectomy 1972  . Laparoscopic tubal ligation   . Total abdominal hysterectomy   . Cataract extraction   . Colonoscopy 08/2010  . Total knee arthroplasty  Right knee in June 2012    ZOX:WRUEAV of systems complete and found to be negative unless listed above PHYSICAL EXAM BP 148/98  Pulse 64  Resp 18  Ht 5\' 4"  (1.626 m)  Wt 239 lb (108.41 kg)  BMI 41.02 kg/m2  General: Well developed, well nourished, in no acute distress Head: Eyes PERRLA, No xanthomas.   Normal cephalic and atramatic  Lungs: Clear bilaterally to auscultation and percussion. Heart: HRRR S1 S2, a little tachycardic.without MRG.  Pulses are 2+ & equal.            No carotid bruit. No JVD.  No abdominal bruits. No femoral bruits. Abdomen: Bowel sounds are positive, abdomen soft and non-tender without masses or                  Hernia's noted. Msk:  Back normal, normal gait. Normal strength and tone for age. Extremities: No clubbing, cyanosis or edema.  DP +1 Neuro: Alert and oriented X 3. Psych:  Good affect, responds appropriately, mildly anxious.  EKG:NSR rate of 64 bpm  ASSESSMENT AND PLAN

## 2012-03-16 NOTE — Patient Instructions (Signed)
Your physician recommends that you schedule a follow-up appointment in: 2 weeks to 1 month with Dr Dietrich Pates  Your physician has recommended that you wear a holter monitor. Holter monitors are medical devices that record the heart's electrical activity. Doctors most often use these monitors to diagnose arrhythmias. Arrhythmias are problems with the speed or rhythm of the heartbeat. The monitor is a small, portable device. You can wear one while you do your normal daily activities. This is usually used to diagnose what is causing palpitations/syncope (passing out).

## 2012-03-17 ENCOUNTER — Ambulatory Visit (HOSPITAL_COMMUNITY)
Admission: RE | Admit: 2012-03-17 | Discharge: 2012-03-17 | Disposition: A | Discharge status: Home / Self Care | Payer: BC Managed Care – PPO | Source: Ambulatory Visit | Attending: Adult Health | Admitting: Adult Health

## 2012-03-17 DIAGNOSIS — I4892 Unspecified atrial flutter: Secondary | ICD-10-CM

## 2012-03-17 DIAGNOSIS — I10 Essential (primary) hypertension: Secondary | ICD-10-CM

## 2012-03-17 NOTE — Progress Notes (Signed)
*  PRELIMINARY RESULTS* Echocardiogram 2D Echocardiogram and 48H Holter monitor has been performed.  [name]  03/17/2012, 2:18 PM

## 2012-03-23 NOTE — Procedures (Signed)
[name], [name]                ACCOUNT NO.:  [id number]  MEDICAL RECORD NO.:  [id number]  LOCATION:  CARDIOPU                      FACILITY:  [facility]  PHYSICIAN:  Gerrit Friends. Dietrich Pates, MD, FACCDATE OF BIRTH:  Feb 19, 1951  DATE OF PROCEDURE:  03/18/2012 DATE OF DISCHARGE:  03/18/2012                               HOLTER MONITOR   REFERRING PHYSICIAN:  Bettey Mare. Lyman Bishop, NP  CLINICAL DATA:  A 61 year old woman with a history of atrial flutter.  1. Continuous electrocardiographic recording was maintained for 47     hours and 43 minutes during which time, the predominant rhythm was     normal sinus with modest sinus tachycardia to a peak heart rate of     120, and modest sinus bradycardia to a low heart rate of 50 bpm. 2. Rare ventricular ectopics occurred with an average rate of 2 per     hour. 3. Rare supraventricular ectopics were also noted, occurring at a mean     rate of 3 per hour. 4. No significant ST-segment elevation or depression was detected. 5. A diary of activity was returned noting 10 episodes during which     the patient experienced palpitations, dyspnea, and chest     discomfort.  Most of these had a duration of approximately 30     seconds, but a few were much briefer.  Normal sinus rhythm or sinus     bradycardia was present in each instance.  A single PVC was     recorded during two of these spells.  There was repetitive     electrical interference in 1 of 3 channels during the first 3 or 4     of these episodes.  IMPRESSION:  Unremarkable continuous electrocardiographic recording revealing no significant arrhythmias, but multiple symptomatic spells. Premature ventricular contractions were somewhat overrepresented during symptoms, but were absent for most of the episodes.  Other findings as noted.     Gerrit Friends. Dietrich Pates, MD, [facility]     RMR/MEDQ  D:  03/23/2012  T:  03/23/2012  Job:  [id number]

## 2012-04-03 ENCOUNTER — Ambulatory Visit: Payer: BC Managed Care – PPO | Admitting: Cardiology

## 2012-04-10 ENCOUNTER — Ambulatory Visit: Payer: BC Managed Care – PPO | Admitting: Adult Health

## 2012-05-01 ENCOUNTER — Encounter: Payer: Self-pay | Admitting: Critical Care Medicine

## 2012-05-01 ENCOUNTER — Ambulatory Visit (INDEPENDENT_AMBULATORY_CARE_PROVIDER_SITE_OTHER): Payer: BC Managed Care – PPO | Admitting: Critical Care Medicine

## 2012-05-01 ENCOUNTER — Other Ambulatory Visit (INDEPENDENT_AMBULATORY_CARE_PROVIDER_SITE_OTHER): Payer: BC Managed Care – PPO

## 2012-05-01 VITALS — BP 90/60 | HR 72 | Temp 98.7°F | Ht 64.0 in | Wt 250.0 lb

## 2012-05-01 DIAGNOSIS — G4733 Obstructive sleep apnea (adult) (pediatric): Secondary | ICD-10-CM

## 2012-05-01 DIAGNOSIS — D649 Anemia, unspecified: Secondary | ICD-10-CM

## 2012-05-01 DIAGNOSIS — R0602 Shortness of breath: Secondary | ICD-10-CM

## 2012-05-01 LAB — CBC WITH DIFFERENTIAL/PLATELET
Eosinophils Relative: 0.2 % (ref 0.0–5.0)
HCT: 45.2 % (ref 36.0–46.0)
Hemoglobin: 15.2 g/dL — ABNORMAL HIGH (ref 12.0–15.0)
Lymphs Abs: 3 10*3/uL (ref 0.7–4.0)
MCV: 89.8 fl (ref 78.0–100.0)
Monocytes Absolute: 0.7 10*3/uL (ref 0.1–1.0)
Monocytes Relative: 6.3 % (ref 3.0–12.0)
Neutro Abs: 7 10*3/uL (ref 1.4–7.7)
RDW: 13.1 % (ref 11.5–14.6)
WBC: 10.7 10*3/uL — ABNORMAL HIGH (ref 4.5–10.5)

## 2012-05-01 NOTE — Progress Notes (Signed)
Quick Note:  Call pt and tell her labs are ok, No change in medications There is no anemia now and her White count is ok, normal ______

## 2012-05-01 NOTE — Progress Notes (Signed)
Subjective:    Patient ID: [name], female    DOB: 1951-04-18, 61 y.o.   MRN: [id number]  HPI History of Present Illness:  Pulmonary OV  61 yo WF referred for eval of ?pulm HTN and sleep apnea.  Pt was dx with pulm HTN due to sleep apnea early 2000's Pt weighed 325#. Pt then had sleep study and rx cpap @11cmh20 . Now on sleep apnea mask. Despite this pt is getting more dyspnea and fatigue. Notes pain in upper chest . Notes more spells of bronchitis in summer. Weight is down to 261.  Not as much selling with diuretic. Pt with PUD. and had gastrectomy in the past.  No prior dx of AMI or CAD. Dx HTN and CVA in past. The chest hurts and has a dull ache. Pt is on glycopyrrolate for bowels.  Pt here for eval of pulm HTN, but note that recent echo did not show any pulm HTN>  November 27, 2010 10:20 AM  The pts dyspnea is the same. There is not much edema. The pt travels alot and legs will swell  L ankle swells more that the other. Pain only with extreme exertion. No real wheeze. PFTs were normal except mild dlco reduction corrects with VA.  The pt now has cpap now at 7cmh20. No daytime hypersomnolence is noted.  Pt denies any significant sore throat, nasal congestion or excess secretions, fever, chills, sweats, unintended weight loss, pleurtic or exertional chest pain, orthopnea PND, or leg swelling  Pt denies any increase in rescue therapy over baseline, denies waking up needing it or having any early am or nocturnal exacerbations of coughing/wheezing/or dyspnea.  The pt continues to lose weight successfully   05/01/2012 Not seen since 2011.   Dyspnea same from 2011.  Cannot walk long distances.  Eg:  Sl housework is an issue.  Walking in grocery store and will pour sweat.  Will awaken at night.   Swelling in ankles persists.  Uses Cpap every night.  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      Past Medical History  Diagnosis Date  . Atrial fibrillation   . Degenerative  joint disease   . Obesity   . Vertigo   . Sleep apnea     treated with CPAP  . Hypertension   . Fibromyalgia   . PUD (peptic ulcer disease)   . Hyperplastic colon polyp   . Diverticulosis   . Anxiety disorder      Family History  Problem Relation Age of Onset  . Emphysema Father      History   Social History  . Marital Status: Married    Spouse Name: N/A    Number of Children: 4  . Years of Education: N/A   Occupational History  . retired     Public house manager   Social History Main Topics  . Smoking status: Never Smoker   . Smokeless tobacco: Never Used  . Alcohol Use: No  . Drug Use: No  . Sexually Active: Not on file   Other Topics Concern  . Not on file   Social History Narrative  . No narrative on file     Allergies  Allergen Reactions  . Codeine Itching  . Penicillins Nausea And Vomiting  . Sulfonamide Derivatives     REACTION: itching, GI upset     Outpatient Prescriptions Prior to Visit  Medication Sig Dispense Refill  . ALPRAZolam (XANAX) 0.5 MG tablet Take 0.5 mg by mouth  at bedtime as needed.        . celecoxib (CELEBREX) 200 MG capsule Take 200 mg by mouth daily.        . chlorthalidone (HYGROTON) 25 MG tablet Take 0.5 tablets (12.5 mg total) by mouth daily.  15 tablet  3  . co-enzyme Q-10 30 MG capsule Take 30 mg by mouth daily.        . diphenhydrAMINE (BENADRYL) 25 MG tablet Take 25 mg by mouth every 6 (six) hours as needed.        . fentaNYL (DURAGESIC - DOSED MCG/HR) 50 MCG/HR Place 1 patch onto the skin every 3 (three) days.        Marland Kitchen FLUoxetine (PROZAC) 40 MG capsule Take 40 mg by mouth daily.        Marland Kitchen glycopyrrolate (ROBINUL) 2 MG tablet Take 2 mg by mouth as needed.       . hydrocodone-acetaminophen (LORCET-HD) 5-500 MG per capsule Take 1 capsule by mouth every 6 (six) hours as needed.        . hyoscyamine (NULEV) 0.125 MG TBDP Place 0.125 mg under the tongue every 4 (four) hours as needed.       . metoCLOPramide (REGLAN) 5 MG tablet Take 5 mg by  mouth as needed.       Marland Kitchen olmesartan (BENICAR) 40 MG tablet Take 1/2 tab daily      . omeprazole (PRILOSEC) 40 MG capsule Take 40 mg by mouth daily.        Marland Kitchen tiZANidine (ZANAFLEX) 2 MG tablet Take 4 mg by mouth at bedtime.       . fish oil-omega-3 fatty acids 1000 MG capsule Take 2 g by mouth daily.           Review of Systems Constitutional:   No  weight loss, night sweats,  Fevers, chills, fatigue, lassitude. HEENT:   No headaches,  Difficulty swallowing,  Tooth/dental problems,  Sore throat,                No sneezing, itching, ear ache, nasal congestion, post nasal drip,   CV:  No chest pain,  Orthopnea, PND, swelling in lower extremities, anasarca, dizziness, palpitations  GI  No heartburn, indigestion, abdominal pain, nausea, vomiting, diarrhea, change in bowel habits, loss of appetite  Resp: Notes  shortness of breath with exertion not at rest.  No excess mucus, no productive cough,  No non-productive cough,  No coughing up of blood.  No change in color of mucus.  No wheezing.  No chest wall deformity  Skin: no rash or lesions.  GU: no dysuria, change in color of urine, no urgency or frequency.  No flank pain.  MS:  No joint pain or swelling.  No decreased range of motion.  No back pain.  Psych:  No change in mood or affect. No depression or anxiety.  No memory loss.     Objective:   Physical Exam Filed Vitals:   05/01/12 1518  BP: 90/60  Pulse: 72  Temp: 98.7 F (37.1 C)  TempSrc: Oral  Height: 5\' 4"  (1.626 m)  Weight: 250 lb (113.399 kg)  SpO2: 93%    Gen: Obese female, in no distress,  normal affect  ENT: No lesions,  mouth clear,  oropharynx clear, no postnasal drip  Neck: No JVD, no TMG, no carotid bruits  Lungs: No use of accessory muscles, no dullness to percussion, clear without rales or rhonchi  Cardiovascular: RRR, heart sounds normal, no murmur or  gallops, no peripheral edema  Abdomen: soft and NT, no HSM,  BS normal  Musculoskeletal: No  deformities, no cyanosis or clubbing  Neuro: alert, non focal  Skin: Warm, no lesions or rashes  No results found.        Assessment & Plan:   DYSPNEA Chronic dyspnea with exertion on the basis of restrictive lung disease due to obesity without primary lung disease Patient has had a history of anemia in the past Plan Obtain CBC to evaluate for progressive anemia No additional medication changes No additional pulmonary evaluation  Obstructive sleep apnea Stable obstructive sleep apnea on setting of 7 cm water pressure LAN Maintain CPAP therapy    Updated Medication List Outpatient Encounter Prescriptions as of 05/01/2012  Medication Sig Dispense Refill  . ALPRAZolam (XANAX) 0.5 MG tablet Take 0.5 mg by mouth at bedtime as needed.        . celecoxib (CELEBREX) 200 MG capsule Take 200 mg by mouth daily.        . chlorthalidone (HYGROTON) 25 MG tablet Take 0.5 tablets (12.5 mg total) by mouth daily.  15 tablet  3  . co-enzyme Q-10 30 MG capsule Take 30 mg by mouth daily.        . diphenhydrAMINE (BENADRYL) 25 MG tablet Take 25 mg by mouth every 6 (six) hours as needed.        . fentaNYL (DURAGESIC - DOSED MCG/HR) 50 MCG/HR Place 1 patch onto the skin every 3 (three) days.        Marland Kitchen FLUoxetine (PROZAC) 40 MG capsule Take 40 mg by mouth daily.        Marland Kitchen glycopyrrolate (ROBINUL) 2 MG tablet Take 2 mg by mouth as needed.       . hydrocodone-acetaminophen (LORCET-HD) 5-500 MG per capsule Take 1 capsule by mouth every 6 (six) hours as needed.        . hyoscyamine (NULEV) 0.125 MG TBDP Place 0.125 mg under the tongue every 4 (four) hours as needed.       . metoCLOPramide (REGLAN) 5 MG tablet Take 5 mg by mouth as needed.       Marland Kitchen olmesartan (BENICAR) 40 MG tablet Take 1/2 tab daily      . omeprazole (PRILOSEC) 40 MG capsule Take 40 mg by mouth daily.        Marland Kitchen tiZANidine (ZANAFLEX) 2 MG tablet Take 4 mg by mouth at bedtime.       Marland Kitchen DISCONTD: fish oil-omega-3 fatty acids 1000 MG capsule  Take 2 g by mouth daily.

## 2012-05-01 NOTE — Assessment & Plan Note (Signed)
Stable obstructive sleep apnea on setting of 7 cm water pressure LAN Maintain CPAP therapy

## 2012-05-01 NOTE — Assessment & Plan Note (Signed)
Chronic dyspnea with exertion on the basis of restrictive lung disease due to obesity without primary lung disease Patient has had a history of anemia in the past Plan Obtain CBC to evaluate for progressive anemia No additional medication changes No additional pulmonary evaluation

## 2012-05-01 NOTE — Patient Instructions (Signed)
CBC today, we will call result No change in cpap No other medication changes Return 1 year or as needed

## 2012-05-04 NOTE — Progress Notes (Signed)
Quick Note:  Called pt's home # - [email or web address] ______ 

## 2012-05-04 NOTE — Progress Notes (Signed)
Quick Note:  lmomtcb ______ 

## 2012-05-05 ENCOUNTER — Ambulatory Visit: Payer: BC Managed Care – PPO | Admitting: Cardiology

## 2012-05-05 NOTE — Progress Notes (Signed)
Quick Note:  Call pt's home # - [email or web address] ______

## 2012-05-06 NOTE — Progress Notes (Signed)
Quick Note:  Called pt's home # - [email or web address] ______ 

## 2012-05-07 NOTE — Progress Notes (Signed)
Quick Note:  lmomtcb ______ 

## 2012-05-25 ENCOUNTER — Ambulatory Visit (INDEPENDENT_AMBULATORY_CARE_PROVIDER_SITE_OTHER): Payer: BC Managed Care – PPO | Admitting: Cardiology

## 2012-05-25 ENCOUNTER — Encounter: Payer: Self-pay | Admitting: Cardiology

## 2012-05-25 VITALS — BP 135/69 | HR 55 | Resp 18 | Ht 64.0 in | Wt 243.0 lb

## 2012-05-25 DIAGNOSIS — E785 Hyperlipidemia, unspecified: Secondary | ICD-10-CM

## 2012-05-25 DIAGNOSIS — Z860100 Personal history of colon polyps, unspecified: Secondary | ICD-10-CM

## 2012-05-25 DIAGNOSIS — K279 Peptic ulcer, site unspecified, unspecified as acute or chronic, without hemorrhage or perforation: Secondary | ICD-10-CM

## 2012-05-25 DIAGNOSIS — E669 Obesity, unspecified: Secondary | ICD-10-CM

## 2012-05-25 DIAGNOSIS — F5083 Pica in adults: Secondary | ICD-10-CM

## 2012-05-25 DIAGNOSIS — M1990 Unspecified osteoarthritis, unspecified site: Secondary | ICD-10-CM

## 2012-05-25 DIAGNOSIS — I4891 Unspecified atrial fibrillation: Secondary | ICD-10-CM

## 2012-05-25 DIAGNOSIS — I10 Essential (primary) hypertension: Secondary | ICD-10-CM

## 2012-05-25 DIAGNOSIS — IMO0001 Reserved for inherently not codable concepts without codable children: Secondary | ICD-10-CM

## 2012-05-25 DIAGNOSIS — F5089 Other specified eating disorder: Secondary | ICD-10-CM

## 2012-05-25 DIAGNOSIS — G4733 Obstructive sleep apnea (adult) (pediatric): Secondary | ICD-10-CM

## 2012-05-25 DIAGNOSIS — I119 Hypertensive heart disease without heart failure: Secondary | ICD-10-CM

## 2012-05-25 LAB — COMPREHENSIVE METABOLIC PANEL
ALT: 11 U/L (ref 0–35)
CO2: 29 mEq/L (ref 19–32)
Calcium: 9.6 mg/dL (ref 8.4–10.5)
Chloride: 102 mEq/L (ref 96–112)
Potassium: 4.9 mEq/L (ref 3.5–5.3)
Sodium: 141 mEq/L (ref 135–145)
Total Protein: 7.4 g/dL (ref 6.0–8.3)

## 2012-05-25 LAB — LIPID PANEL
Cholesterol: 203 mg/dL — ABNORMAL HIGH (ref 0–200)
VLDL: 28 mg/dL (ref 0–40)

## 2012-05-25 NOTE — Patient Instructions (Signed)
**Note De-identified Evona Westra Obfuscation** Your physician recommends that you continue on your current medications as directed. Please refer to the Current Medication list given to you today.  Your physician recommends that you return for lab work in: today  Your physician recommends that you schedule a follow-up appointment in: 1 year   

## 2012-05-25 NOTE — Progress Notes (Deleted)
Name: [name]    DOB: October 08, 1951  Age: 60 y.o.  MR#: [id number]       PCP:  Colette Ribas, MD, MD      Insurance: @PAYORNAME @   CC:    Chief Complaint  Patient presents with  . Appointment    no complaints -meds/list    VS BP 135/69  Pulse 55  Resp 18  Ht 5\' 4"  (1.626 m)  Wt 243 lb (110.224 kg)  BMI 41.71 kg/m2  Weights Current Weight  05/25/12 243 lb (110.224 kg)  05/01/12 250 lb (113.399 kg)  03/16/12 239 lb (108.41 kg)    Blood Pressure  BP Readings from Last 3 Encounters:  05/25/12 135/69  05/01/12 90/60  03/16/12 148/98     Admit date:  (Not on file) Last encounter with RMR:  Visit date not found   Allergy Allergies  Allergen Reactions  . Codeine Itching  . Penicillins Nausea And Vomiting  . Sulfonamide Derivatives     REACTION: itching, GI upset    Current Outpatient Prescriptions  Medication Sig Dispense Refill  . ALPRAZolam (XANAX) 0.5 MG tablet Take 0.5 mg by mouth at bedtime as needed.        . celecoxib (CELEBREX) 200 MG capsule Take 200 mg by mouth daily.        . chlorthalidone (HYGROTON) 25 MG tablet Take 0.5 tablets (12.5 mg total) by mouth daily.  15 tablet  3  . co-enzyme Q-10 30 MG capsule Take 30 mg by mouth daily.        . diphenhydrAMINE (BENADRYL) 25 MG tablet Take 25 mg by mouth every 6 (six) hours as needed.        . fentaNYL (DURAGESIC - DOSED MCG/HR) 50 MCG/HR Place 1 patch onto the skin every 3 (three) days.        Marland Kitchen FLUoxetine (PROZAC) 40 MG capsule Take 40 mg by mouth daily.        Marland Kitchen glycopyrrolate (ROBINUL) 2 MG tablet Take 2 mg by mouth as needed.       . hydrocodone-acetaminophen (LORCET-HD) 5-500 MG per capsule Take 1 capsule by mouth every 6 (six) hours as needed.        . hyoscyamine (NULEV) 0.125 MG TBDP Place 0.125 mg under the tongue every 4 (four) hours as needed.       . metoCLOPramide (REGLAN) 5 MG tablet Take 5 mg by mouth as needed.       Marland Kitchen olmesartan (BENICAR) 40 MG tablet Take 1/2 tab daily      .  omeprazole (PRILOSEC) 40 MG capsule Take 40 mg by mouth daily.        Marland Kitchen tiZANidine (ZANAFLEX) 2 MG tablet Take 4 mg by mouth at bedtime.         Discontinued Meds:   There are no discontinued medications.  Patient Active Problem List  Diagnoses  . HYPERLIPIDEMIA  . OBESITY  . Pica in adults  . HYPERTENSION  . Hypertensive heart disease  . FIBRILLATION, ATRIAL  . Peptic ulcer disease  . DIVERTICULOSIS-COLON  . DEGENERATIVE JOINT DISEASE  . FIBROMYALGIA  . Obstructive sleep apnea  . DYSPNEA  . COLONIC POLYPS, HX OF  . Muscle weakness (generalized)  . Pain in joint, lower leg  . Stiffness of joint, not elsewhere classified, lower leg    LABS Appointment on 05/01/2012  Component Date Value  . WBC 05/01/2012 10.7*  . RBC 05/01/2012 5.03   . Hemoglobin 05/01/2012 15.2*  . HCT  05/01/2012 45.2   . MCV 05/01/2012 89.8   . MCHC 05/01/2012 33.7   . RDW 05/01/2012 13.1   . Platelets 05/01/2012 351.0   . Neutrophils Relative 05/01/2012 65.3   . Lymphocytes Relative 05/01/2012 27.7   . Monocytes Relative 05/01/2012 6.3   . Eosinophils Relative 05/01/2012 0.2   . Basophils Relative 05/01/2012 0.5   . Neutro Abs 05/01/2012 7.0   . Lymphs Abs 05/01/2012 3.0   . Monocytes Absolute 05/01/2012 0.7   . Eosinophils Absolute 05/01/2012 0.0   . Basophils Absolute 05/01/2012 0.1      Results for this Opt Visit:     Results for orders placed in visit on 05/01/12  CBC WITH DIFFERENTIAL      Component Value Range   WBC 10.7 (*) 4.5 - 10.5 (K/uL)   RBC 5.03  3.87 - 5.11 (Mil/uL)   Hemoglobin 15.2 (*) 12.0 - 15.0 (g/dL)   HCT 16.1  09.6 - 04.5 (%)   MCV 89.8  78.0 - 100.0 (fl)   MCHC 33.7  30.0 - 36.0 (g/dL)   RDW 40.9  81.1 - 91.4 (%)   Platelets 351.0  150.0 - 400.0 (K/uL)   Neutrophils Relative 65.3  43.0 - 77.0 (%)   Lymphocytes Relative 27.7  12.0 - 46.0 (%)   Monocytes Relative 6.3  3.0 - 12.0 (%)   Eosinophils Relative 0.2  0.0 - 5.0 (%)   Basophils Relative 0.5  0.0 - 3.0  (%)   Neutro Abs 7.0  1.4 - 7.7 (K/uL)   Lymphs Abs 3.0  0.7 - 4.0 (K/uL)   Monocytes Absolute 0.7  0.1 - 1.0 (K/uL)   Eosinophils Absolute 0.0  0.0 - 0.7 (K/uL)   Basophils Absolute 0.1  0.0 - 0.1 (K/uL)    EKG Orders placed during the hospital encounter of 03/17/12  . HOLTER MONITOR - 48 HOUR  . HOLTER MONITOR - 48 HOUR  . HOLTER MONITOR - 48 HOUR  . HOLTER MONITOR - 48 HOUR     Prior Assessment and Plan Problem List as of 05/25/2012          Cardiology Problems   HYPERLIPIDEMIA   Last Assessment & Plan Note   03/25/2011 Office Visit Signed 03/25/2011  3:50 PM by Kathlen Brunswick, MD    Patient has moderate hyperlipidemia, but no known vascular disease and no diabetes.  Under the circumstances, pharmacologic intervention is not necessarily warranted.    HYPERTENSION   Last Assessment & Plan Note   03/25/2011 Office Visit Signed 03/25/2011  3:48 PM by Kathlen Brunswick, MD    Blood pressure control has improved with weight loss.  Since patient is eager to see a tangible result of her weight reduction efforts, chlorthalidone will be reduced to 12.5 mg q.d.  Patient will continue to monitor blood pressure at home.    Hypertensive heart disease   FIBRILLATION, ATRIAL   Last Assessment & Plan Note   03/16/2012 Office Visit Signed 03/16/2012  3:08 PM by Jodelle Gross, NP    EKG today does not show evidence of atrial fibrillation. She is experiencing palpitations, and racing heart rate everyday. I will place a 48 hr.cardiac monitor to ascertain frequency and duration of her "surges" as she calls them.  She is due to have labs per Dr. Phillips Odor drawn today. Will get copies of this for our review. No medications will be prescribed at this time. Follow-up with Dr. Dietrich Pates next visit.      Other  OBESITY   Pica in adults   Peptic ulcer disease   DIVERTICULOSIS-COLON   DEGENERATIVE JOINT DISEASE   FIBROMYALGIA   Obstructive sleep apnea   Last Assessment & Plan Note   05/01/2012 Office  Visit Signed 05/01/2012  5:01 PM by Storm Frisk, MD    Stable obstructive sleep apnea on setting of 7 cm water pressure LAN Maintain CPAP therapy    DYSPNEA   Last Assessment & Plan Note   05/01/2012 Office Visit Signed 05/01/2012  5:01 PM by Storm Frisk, MD    Chronic dyspnea with exertion on the basis of restrictive lung disease due to obesity without primary lung disease Patient has had a history of anemia in the past Plan Obtain CBC to evaluate for progressive anemia No additional medication changes No additional pulmonary evaluation    COLONIC POLYPS, HX OF   Muscle weakness (generalized)   Pain in joint, lower leg   Stiffness of joint, not elsewhere classified, lower leg       Imaging: No results found.   FRS Calculation: Score not calculated. Missing: Total Cholesterol

## 2012-05-25 NOTE — Assessment & Plan Note (Signed)
In the absence of clinical recurrence of atrial fibrillation and with a relatively low risk or for thromboembolism, full anticoagulation need not be resumed.

## 2012-05-25 NOTE — Assessment & Plan Note (Addendum)
Patient has apparently not had a repeat sleep study since her marked weight loss.  Dr. Gerilyn Pilgrim may wish to consider performing another test.

## 2012-05-25 NOTE — Assessment & Plan Note (Signed)
Patient is congratulated on the weight loss she has achieved overall, although her weight is up 7 pounds since April of 2012.  She is encouraged renew her efforts at further weight loss.

## 2012-05-25 NOTE — Progress Notes (Signed)
Patient ID: [name], female   DOB: 13-Jan-1951, 61 y.o.   MRN: [id number]  HPI: Scheduled return visit for this very nice woman with hypertension, hyperlipidemia and a history of atrial fibrillation.  Since I last saw her one year ago, she underwent an uncomplicated TKA and was advised to discontinue anticoagulation with warfarin.  She has done well with no apparent thromboembolic events and no documented atrial fibrillation.  Her brother recently died suddenly, and she has been in [place] for the past few weeks providing care to her mother.  We discussed possible causes for his demise.  Prior to Admission medications   Medication Sig Start Date End Date Taking? Authorizing Provider  ALPRAZolam Prudy Feeler) 0.5 MG tablet Take 0.5 mg by mouth at bedtime as needed.     Yes Historical Provider, MD  celecoxib (CELEBREX) 200 MG capsule Take 200 mg by mouth daily.     Yes Historical Provider, MD  chlorthalidone (HYGROTON) 25 MG tablet Take 0.5 tablets (12.5 mg total) by mouth daily. 03/25/11  Yes Kathlen Brunswick, MD  co-enzyme Q-10 30 MG capsule Take 30 mg by mouth daily.     Yes Historical Provider, MD  diphenhydrAMINE (BENADRYL) 25 MG tablet Take 25 mg by mouth every 6 (six) hours as needed.     Yes Historical Provider, MD  fentaNYL (DURAGESIC - DOSED MCG/HR) 50 MCG/HR Place 1 patch onto the skin every 3 (three) days.     Yes Historical Provider, MD  FLUoxetine (PROZAC) 40 MG capsule Take 40 mg by mouth daily.     Yes Historical Provider, MD  glycopyrrolate (ROBINUL) 2 MG tablet Take 2 mg by mouth as needed.    Yes Historical Provider, MD  hydrocodone-acetaminophen (LORCET-HD) 5-500 MG per capsule Take 1 capsule by mouth every 6 (six) hours as needed.     Yes Historical Provider, MD  hyoscyamine (NULEV) 0.125 MG TBDP Place 0.125 mg under the tongue every 4 (four) hours as needed.    Yes Historical Provider, MD  metoCLOPramide (REGLAN) 5 MG tablet Take 5 mg by mouth as needed.    Yes Historical  Provider, MD  olmesartan (BENICAR) 40 MG tablet Take 1/2 tab daily   Yes Historical Provider, MD  omeprazole (PRILOSEC) 40 MG capsule Take 40 mg by mouth daily.     Yes Historical Provider, MD  tiZANidine (ZANAFLEX) 2 MG tablet Take 4 mg by mouth at bedtime.    Yes Historical Provider, MD   Allergies  Allergen Reactions  . Codeine Itching  . Penicillins Nausea And Vomiting  . Sulfonamide Derivatives     REACTION: itching, GI upset     Past medical history, social history, and family history reviewed and updated.  ROS: Denies chest discomfort, orthopnea, PND, ankle edema, palpitations, lightheadedness or syncope.  All other systems reviewed and are negative.  PHYSICAL EXAM: BP 135/69  Pulse 55  Resp 18  Ht 5\' 4"  (1.626 m)  Wt 110.224 kg (243 lb)  BMI 41.71 kg/m2  General-Well developed; no acute distress Body habitus-Moderately to markedly overweight Neck-No JVD; no carotid bruits Lungs-clear lung fields; resonant to percussion; decreased breath sounds at the bases Cardiovascular-normal PMI; normal S1 and S2; regular rhythm; S4 present; occasional premature Abdomen-normal bowel sounds; soft and non-tender without masses or organomegaly Musculoskeletal-No deformities, no cyanosis or clubbing Neurologic-Normal cranial nerves; symmetric strength and tone Skin-Warm, no significant lesions Extremities-distal pulses intact; no edema  ASSESSMENT AND PLAN:  [place] Bing, MD 05/25/2012 11:32 AM

## 2012-05-27 ENCOUNTER — Encounter: Payer: Self-pay | Admitting: Cardiology

## 2012-05-27 NOTE — Assessment & Plan Note (Signed)
Lipid profile is suboptimal, but, in the absence of known vascular disease, pharmacologic therapy is not necessary.

## 2012-05-28 ENCOUNTER — Encounter: Payer: Self-pay | Admitting: *Deleted

## 2012-09-18 ENCOUNTER — Encounter: Payer: Self-pay | Admitting: Internal Medicine

## 2012-11-03 ENCOUNTER — Emergency Department (HOSPITAL_COMMUNITY)
Admission: EM | Admit: 2012-11-03 | Discharge: 2012-11-03 | Disposition: A | Discharge status: Home / Self Care | Payer: BC Managed Care – PPO | Attending: Emergency Medicine | Admitting: Emergency Medicine

## 2012-11-03 ENCOUNTER — Other Ambulatory Visit: Payer: Self-pay

## 2012-11-03 ENCOUNTER — Encounter (HOSPITAL_COMMUNITY): Payer: Self-pay | Admitting: *Deleted

## 2012-11-03 ENCOUNTER — Emergency Department (HOSPITAL_COMMUNITY): Payer: BC Managed Care – PPO

## 2012-11-03 DIAGNOSIS — Z79899 Other long term (current) drug therapy: Secondary | ICD-10-CM | POA: Insufficient documentation

## 2012-11-03 DIAGNOSIS — I4891 Unspecified atrial fibrillation: Secondary | ICD-10-CM | POA: Insufficient documentation

## 2012-11-03 DIAGNOSIS — R109 Unspecified abdominal pain: Secondary | ICD-10-CM | POA: Insufficient documentation

## 2012-11-03 DIAGNOSIS — R002 Palpitations: Secondary | ICD-10-CM | POA: Insufficient documentation

## 2012-11-03 DIAGNOSIS — I10 Essential (primary) hypertension: Secondary | ICD-10-CM | POA: Insufficient documentation

## 2012-11-03 DIAGNOSIS — Z860100 Personal history of colon polyps, unspecified: Secondary | ICD-10-CM | POA: Insufficient documentation

## 2012-11-03 DIAGNOSIS — K5900 Constipation, unspecified: Secondary | ICD-10-CM | POA: Insufficient documentation

## 2012-11-03 DIAGNOSIS — F411 Generalized anxiety disorder: Secondary | ICD-10-CM | POA: Insufficient documentation

## 2012-11-03 DIAGNOSIS — Z8719 Personal history of other diseases of the digestive system: Secondary | ICD-10-CM | POA: Insufficient documentation

## 2012-11-03 DIAGNOSIS — M549 Dorsalgia, unspecified: Secondary | ICD-10-CM | POA: Insufficient documentation

## 2012-11-03 DIAGNOSIS — Z8739 Personal history of other diseases of the musculoskeletal system and connective tissue: Secondary | ICD-10-CM | POA: Insufficient documentation

## 2012-11-03 DIAGNOSIS — K279 Peptic ulcer, site unspecified, unspecified as acute or chronic, without hemorrhage or perforation: Secondary | ICD-10-CM | POA: Insufficient documentation

## 2012-11-03 DIAGNOSIS — G4730 Sleep apnea, unspecified: Secondary | ICD-10-CM | POA: Insufficient documentation

## 2012-11-03 DIAGNOSIS — E669 Obesity, unspecified: Secondary | ICD-10-CM | POA: Insufficient documentation

## 2012-11-03 DIAGNOSIS — IMO0001 Reserved for inherently not codable concepts without codable children: Secondary | ICD-10-CM | POA: Insufficient documentation

## 2012-11-03 DIAGNOSIS — D72829 Elevated white blood cell count, unspecified: Secondary | ICD-10-CM

## 2012-11-03 DIAGNOSIS — R112 Nausea with vomiting, unspecified: Secondary | ICD-10-CM

## 2012-11-03 LAB — BASIC METABOLIC PANEL
BUN: 24 mg/dL — ABNORMAL HIGH (ref 6–23)
CO2: 28 mEq/L (ref 19–32)
Calcium: 9.9 mg/dL (ref 8.4–10.5)
Creatinine, Ser: 1.17 mg/dL — ABNORMAL HIGH (ref 0.50–1.10)
GFR calc non Af Amer: 49 mL/min — ABNORMAL LOW (ref >90)
Glucose, Bld: 187 mg/dL — ABNORMAL HIGH (ref 70–99)

## 2012-11-03 LAB — CBC WITH DIFFERENTIAL/PLATELET
Eosinophils Absolute: 0 10*3/uL (ref 0.0–0.7)
Eosinophils Relative: 0 % (ref 0–5)
HCT: 44.8 % (ref 36.0–46.0)
Lymphocytes Relative: 6 % — ABNORMAL LOW (ref 12–46)
Lymphs Abs: 1 10*3/uL (ref 0.7–4.0)
MCH: 31.2 pg (ref 26.0–34.0)
MCV: 91.4 fL (ref 78.0–100.0)
Monocytes Absolute: 0.5 10*3/uL (ref 0.1–1.0)
Monocytes Relative: 3 % (ref 3–12)
Platelets: 366 10*3/uL (ref 150–400)
RBC: 4.9 MIL/uL (ref 3.87–5.11)
WBC: 17.6 10*3/uL — ABNORMAL HIGH (ref 4.0–10.5)

## 2012-11-03 LAB — HEPATIC FUNCTION PANEL
Albumin: 4 g/dL (ref 3.5–5.2)
Alkaline Phosphatase: 113 U/L (ref 39–117)
Bilirubin, Direct: 0.1 mg/dL (ref 0.0–0.3)
Total Bilirubin: 0.4 mg/dL (ref 0.3–1.2)

## 2012-11-03 LAB — TROPONIN I: Troponin I: <0.3 ng/mL (ref <0.30)

## 2012-11-03 LAB — LIPASE, BLOOD: Lipase: 44 U/L (ref 11–59)

## 2012-11-03 MED ORDER — HYDROMORPHONE HCL PF 1 MG/ML IJ SOLN
0.5000 mg | Freq: Once | INTRAMUSCULAR | Status: AC
  Administered 2012-11-03: 0.5 mg via INTRAVENOUS
  Filled 2012-11-03: qty 1

## 2012-11-03 MED ORDER — PROMETHAZINE HCL 25 MG/ML IJ SOLN
12.5000 mg | Freq: Once | INTRAMUSCULAR | Status: AC
  Administered 2012-11-03: 12.5 mg via INTRAVENOUS
  Filled 2012-11-03: qty 1

## 2012-11-03 MED ORDER — ONDANSETRON HCL 4 MG/2ML IJ SOLN
INTRAMUSCULAR | Status: AC
  Administered 2012-11-03: 4 mg
  Filled 2012-11-03: qty 2

## 2012-11-03 MED ORDER — PROMETHAZINE HCL 25 MG PO TABS: 12.5000 mg | ORAL_TABLET | Freq: Four times a day (QID) | ORAL | Status: DC | PRN

## 2012-11-03 MED ORDER — IOHEXOL 300 MG/ML  SOLN
120.0000 mL | Freq: Once | INTRAMUSCULAR | Status: AC | PRN
  Administered 2012-11-03: 120 mL via INTRAVENOUS

## 2012-11-03 MED ORDER — PROMETHAZINE HCL 25 MG RE SUPP: 25.0000 mg | Freq: Four times a day (QID) | RECTAL | Status: DC | PRN

## 2012-11-03 NOTE — ED Notes (Signed)
Pt back from CT at this time, notes improvement in nausea.

## 2012-11-03 NOTE — ED Notes (Signed)
Pt notes intolerable pain to her mid lower abdomen, abdomen appears distended and is very tender on palpation. Also notes n/v. States this all suddenly started 2 hours ago.

## 2012-11-03 NOTE — ED Notes (Signed)
Pt reporting generalized abdominal pain, reports most severe pain in right lower quadrant.  Pt also reports vomiting multiple times.  Symptoms began about 2 hours ago.

## 2012-11-03 NOTE — Progress Notes (Signed)
0130 Assumed care/disposition of the patient. Patient presents with nausea, vomiting and abdominal pain. Acute abdominal series negative for obstruction. Labs with elevated wbc. Awaiting CT scan.Patient advised that she had diarrhea x 1 week last week and took immodium daily.  4540 CT with possible low grade partial small bowel obstruction. Colonic diverticulosis with no sign of infection. Patient is resting comfortably. No further vomiting. Able to take PO fluids. Reviewed results with patient and her husband. Will discharge home with phenergan and suppositories. She will follow a bland diet and gradually advance her diet.    Ct Abdomen Pelvis W Contrast  11/03/2012  *RADIOLOGY REPORT*  Clinical Data: Nausea, vomiting, abdominal pain.  CT ABDOMEN AND PELVIS WITH CONTRAST  Technique:  Multidetector CT imaging of the abdomen and pelvis was performed following the standard protocol during bolus administration of intravenous contrast.  Contrast: OMNIPAQUE IOHEXOL 300 MG/ML  SOLN  Comparison: None.  Findings: Lung bases are clear.  No effusions.  Heart is normal size.  Prior cholecystectomy and Billroth procedure.  No focal lesion in the liver, spleen, pancreas, adrenals or kidneys.  Mildly prominent proximal and mid small bowel loops.  Distal small bowel loops are decompressed in caliber.  Exact transition point is not seen, but I cannot exclude a low grade partial small bowel obstruction. Descending colonic and sigmoid diverticulosis.  No active diverticulitis.  Aorta is normal caliber.  No free fluid, free air or adenopathy. Prior hysterectomy.  No adnexal masses.  Urinary bladder is unremarkable.  No acute bony abnormality.  Degenerative disc and facet disease. Slight anterolisthesis of L4 on L5.  IMPRESSION: Mild caliber change within the distal small bowel from mildly prominent proximally to decompressed distally.  Cannot exclude low grade partial small bowel obstruction.  Colonic diverticulosis.    Original Report Authenticated By: Charlett Nose, M.D.    Dg Abd Acute W/chest  11/03/2012  *RADIOLOGY REPORT*  Clinical Data: Nausea, vomiting, abdominal pain.  ACUTE ABDOMEN SERIES (ABDOMEN 2 VIEW & CHEST 1 VIEW)  Comparison: 06/10/2011  Findings: Nonobstructive bowel gas pattern.  No free air.  No organomegaly or suspicious calcification.  Heart and mediastinal contours are within normal limits.  No focal opacities or effusions.  No acute bony abnormality.  Degenerative changes in the hips and lumbar spine.  IMPRESSION: No acute findings.   Original Report Authenticated By: Charlett Nose, M.D.

## 2012-11-03 NOTE — ED Provider Notes (Signed)
History     CSN: [id number]  Arrival date & time 11/03/12  0008   First MD Initiated Contact with Patient 11/03/12 0011      Chief Complaint  Patient presents with  . Nausea  . Emesis  . Abdominal Pain    (Consider location/radiation/quality/duration/timing/severity/associated sxs/prior treatment) Patient is a 61 y.o. female presenting with vomiting and abdominal pain. The history is provided by the patient and the spouse.  Emesis  This is a new problem. The current episode started 1 to 2 hours ago. The problem occurs 5 to 10 times per day. The problem has been gradually worsening. The emesis has an appearance of stomach contents. There has been no fever. Associated symptoms include abdominal pain and sweats. Pertinent negatives include no arthralgias and no cough.  Abdominal Pain The primary symptoms of the illness include abdominal pain and vomiting. The primary symptoms of the illness do not include shortness of breath or dysuria.  Additional symptoms associated with the illness include constipation and back pain. Symptoms associated with the illness do not include hematuria or frequency.    Past Medical History  Diagnosis Date  . Atrial fibrillation   . Degenerative joint disease     Lumbosacral spine; status post right TKA  . Obesity   . Vertigo   . Sleep apnea     treated with CPAP  . Hypertension   . Fibromyalgia   . PUD (peptic ulcer disease)   . Hyperplastic colon polyp   . Diverticulosis   . Anxiety disorder     Past Surgical History  Procedure Date  . Appendectomy 1968  . Bilroth i procedure 1970s; 2002    s/p Bilroth 1 1970 for peptic ulcer disease;  revision in 2002  . Cholecystectomy 1972  . Laparoscopic tubal ligation   . Total abdominal hysterectomy   . Cataract extraction   . Colonoscopy 08/2010  . Total knee arthroplasty 05/2011    Right     Family History  Problem Relation Age of Onset  . Emphysema Father     History  Substance Use  Topics  . Smoking status: Never Smoker   . Smokeless tobacco: Never Used  . Alcohol Use: No    OB History    Grav Para Term Preterm Abortions TAB SAB Ect Mult Living                  Review of Systems  Constitutional: Negative for activity change.       All ROS Neg except as noted in HPI  HENT: Negative for nosebleeds and neck pain.   Eyes: Negative for photophobia and discharge.  Respiratory: Negative for cough, shortness of breath and wheezing.   Cardiovascular: Positive for palpitations. Negative for chest pain.  Gastrointestinal: Positive for vomiting, abdominal pain and constipation. Negative for blood in stool.  Genitourinary: Negative for dysuria, frequency and hematuria.  Musculoskeletal: Positive for back pain. Negative for arthralgias.  Skin: Negative.   Neurological: Negative for dizziness, seizures and speech difficulty.  Psychiatric/Behavioral: Negative for hallucinations and confusion.    Allergies  Codeine; Penicillins; and Sulfonamide derivatives  Home Medications   Current Outpatient Rx  Name  Route  Sig  Dispense  Refill  . ALPRAZOLAM 0.5 MG PO TABS   Oral   Take 0.5 mg by mouth at bedtime as needed.           . CELECOXIB 200 MG PO CAPS   Oral   Take 200 mg by mouth daily.           Marland Kitchen  CHLORTHALIDONE 25 MG PO TABS   Oral   Take 0.5 tablets (12.5 mg total) by mouth daily.   15 tablet   3   . COENZYME Q10 30 MG PO CAPS   Oral   Take 30 mg by mouth daily.           Marland Kitchen DIPHENHYDRAMINE HCL 25 MG PO TABS   Oral   Take 25 mg by mouth every 6 (six) hours as needed.           . FENTANYL 50 MCG/HR TD PT72   Transdermal   Place 1 patch onto the skin every 3 (three) days.           Marland Kitchen FLUOXETINE HCL 40 MG PO CAPS   Oral   Take 40 mg by mouth daily.           Marland Kitchen GLYCOPYRROLATE 2 MG PO TABS   Oral   Take 2 mg by mouth as needed.          Marland Kitchen HYDROCODONE-ACETAMINOPHEN 5-500 MG PO CAPS   Oral   Take 1 capsule by mouth every 6 (six) hours  as needed.           Marland Kitchen HYOSCYAMINE SULFATE 0.125 MG PO TBDP   Sublingual   Place 0.125 mg under the tongue every 4 (four) hours as needed.          Marland Kitchen METOCLOPRAMIDE HCL 5 MG PO TABS   Oral   Take 5 mg by mouth as needed.          Marland Kitchen OLMESARTAN MEDOXOMIL 40 MG PO TABS      Take 1/2 tab daily         . OMEPRAZOLE 40 MG PO CPDR   Oral   Take 40 mg by mouth daily.           Marland Kitchen TIZANIDINE HCL 2 MG PO TABS   Oral   Take 4 mg by mouth at bedtime.            BP 139/93  Pulse 96  Temp 98.9 F (37.2 C) (Oral)  Resp 22  Ht 5\' 5"  (1.651 m)  Wt 250 lb (113.399 kg)  BMI 41.60 kg/m2  SpO2 96%  Physical Exam  Nursing note and vitals reviewed. Constitutional: She is oriented to person, place, and time. She appears well-developed and well-nourished.  Non-toxic appearance.  HENT:  Head: Normocephalic.  Right Ear: Tympanic membrane and external ear normal.  Left Ear: Tympanic membrane and external ear normal.  Mouth/Throat: Oropharynx is clear and moist.  Eyes: EOM and lids are normal. Pupils are equal, round, and reactive to light.  Neck: Normal range of motion. Neck supple. Carotid bruit is not present.  Cardiovascular: Normal rate, regular rhythm, normal heart sounds, intact distal pulses and normal pulses.   Pulmonary/Chest: Breath sounds normal. No respiratory distress. She exhibits tenderness (bowel sounds decreased.).  Abdominal: Soft. There is tenderness. There is no guarding.       Bowel sounds decreased.  Musculoskeletal: Normal range of motion.  Lymphadenopathy:       Head (right side): No submandibular adenopathy present.       Head (left side): No submandibular adenopathy present.    She has no cervical adenopathy.  Neurological: She is alert and oriented to person, place, and time. She has normal strength. No cranial nerve deficit or sensory deficit.  Skin: Skin is warm and dry.  Psychiatric: Her speech is normal. Her mood appears anxious.  ED Course    Procedures (including critical care time)  Labs Reviewed - No data to display No results found. EKG 0030. Rate 78. Rhythm - Normal Sinus. PR - wnl. Axis - Normal. QRS - wnl. ST - Non-specific changes. No STEMI. No Life threatening arrhythmias. Compared to March 16, 2012, no significant changes.  No diagnosis found.    MDM  I have reviewed nursing notes, vital signs, and all appropriate lab and imaging results for this patient. Pt treated with IV zofran. Pt continues to have active vomiting. IV promethazine given. Acute Abd Xrays Neg for obstruction. CT scan obtained to evaluation abd pain and vomiting. Pain and vomiting improving after iv meds. Pt care to be continued by Dr Colon Branch.       Kathie Dike, [place] 11/03/12 [phone]

## 2012-11-04 NOTE — ED Provider Notes (Signed)
Medical screening examination/treatment/procedure(s) were performed by non-physician practitioner and as supervising physician I was immediately available for consultation/collaboration.  Nicoletta Dress. Colon Branch, MD 11/04/12 [phone]

## 2013-02-06 ENCOUNTER — Other Ambulatory Visit: Payer: Self-pay

## 2013-07-16 ENCOUNTER — Ambulatory Visit (INDEPENDENT_AMBULATORY_CARE_PROVIDER_SITE_OTHER): Payer: BC Managed Care – PPO | Admitting: Cardiovascular Disease

## 2013-07-16 ENCOUNTER — Encounter: Payer: Self-pay | Admitting: Cardiovascular Disease

## 2013-07-16 VITALS — BP 116/68 | HR 74 | Ht 65.0 in | Wt 247.8 lb

## 2013-07-16 DIAGNOSIS — R0600 Dyspnea, unspecified: Secondary | ICD-10-CM

## 2013-07-16 DIAGNOSIS — R0989 Other specified symptoms and signs involving the circulatory and respiratory systems: Secondary | ICD-10-CM

## 2013-07-16 DIAGNOSIS — I4891 Unspecified atrial fibrillation: Secondary | ICD-10-CM

## 2013-07-16 DIAGNOSIS — H538 Other visual disturbances: Secondary | ICD-10-CM

## 2013-07-16 DIAGNOSIS — I10 Essential (primary) hypertension: Secondary | ICD-10-CM

## 2013-07-16 NOTE — Progress Notes (Signed)
Patient ID: JAMYLAH MARINACCIO, female   DOB: 17-Nov-1951, 62 y.o.   MRN: [id number]    SUBJECTIVE: Mrs. Bickham has a h/o paroxysmal atrial fibrillation, HTN, obstructive sleep apnea (uses CPAP nightly), obesity, and dyslipidemia. She is not on anticoagulation.  She's had what she calls "surges" which occur in the chest, and are "very random and last a minute once every few months". They scare her when they do occur, but again, she emphasizes that these episodes are very rare. They are not associated with chest pain. She's been instructed to lie down in the past, and then she feels better.  Her primary complaint is blurriness of vision, which occurs during the morning hours only, primarily when she awakens. She says she was evaluated by her eye doctor who said he couldn't find anything which would cause this. It lasts 2-3 hours and then resolves spontaneously. She's had some mild headaches, but denies nausea and vomiting. These headaches are very rare. She's had balance problems which are chronic, but this hasn't worsened recently.  She also dyspnea on exertion. She goes up and down her basement stairs to the washer and dryer (13 stairs) which causes her to get short of breath and then she has to sit down. The shortness of breath hasn't been getting worse, and appears to be chronic. She very rarely experiences chest pains which may last 2 seconds.  She had an echocardiogram in August 2011 which showed mild LVH and normal LV systolic function.   Filed Vitals:   07/16/13 0913  BP: 116/68  Pulse: 74  Height: 5\' 5"  (1.651 m)  Weight: 247 lb 12 oz (112.379 kg)     PHYSICAL EXAM General: NAD Neck: No JVD, no thyromegaly or thyroid nodule.  Lungs: Clear to auscultation bilaterally with normal respiratory effort. CV: Nondisplaced PMI.  Heart regular S1/S2, no S3/S4, no murmur.  No peripheral edema.  No carotid bruit.  Normal pedal pulses.  Abdomen: Soft, nontender, no hepatosplenomegaly, no  distention.  Neurologic: Alert and oriented x 3.  Psych: Normal affect. Extremities: No clubbing or cyanosis.    LABS: Basic Metabolic Panel: No results found for this basename: NA, K, CL, CO2, GLUCOSE, BUN, CREATININE, CALCIUM, MG, PHOS,  in the last 72 hours Liver Function Tests: No results found for this basename: AST, ALT, ALKPHOS, BILITOT, PROT, ALBUMIN,  in the last 72 hours No results found for this basename: LIPASE, AMYLASE,  in the last 72 hours CBC: No results found for this basename: WBC, NEUTROABS, HGB, HCT, MCV, PLT,  in the last 72 hours Cardiac Enzymes: No results found for this basename: CKTOTAL, CKMB, CKMBINDEX, TROPONINI,  in the last 72 hours BNP: No components found with this basename: POCBNP,  D-Dimer: No results found for this basename: DDIMER,  in the last 72 hours Hemoglobin A1C: No results found for this basename: HGBA1C,  in the last 72 hours Fasting Lipid Panel: No results found for this basename: CHOL, HDL, LDLCALC, TRIG, CHOLHDL, LDLDIRECT,  in the last 72 hours Thyroid Function Tests: No results found for this basename: TSH, T4TOTAL, FREET3, T3FREE, THYROIDAB,  in the last 72 hours Anemia Panel: No results found for this basename: VITAMINB12, FOLATE, FERRITIN, TIBC, IRON, RETICCTPCT,  in the last 72 hours  ECG: sinus rhythm, 76 bpm, with non-specific T wave abnormality   ASSESSMENT AND PLAN: 1. Blurriness of vision: her BP is controlled which is reassuring (no hypertension or hypotension). She has no carotid bruits which would suggest stenosis. I've asked her  to have her meds reviewed by her pharmacist to make sure that this isn't the side effect of a med or combination of meds which she's taking, given that her eye doctor told her eyes were normal. 2. Paroxysmal atrial fibrillation: no recent episodes and pt is relatively asymptomatic, as palpitations occur very rarely. 3. HTN: controlled. 4. Dyspnea on exertion: this appears to be chronic and  stable, and may be related to weight gain and deconditioning. Her last echo 3 years ago was normal, and I'm not inclined to pursue any further cardiac testing at this time unless her symptoms became progressive.   Prentice Docker, M.D., F.A.C.C.

## 2013-07-16 NOTE — Patient Instructions (Addendum)
Your physician recommends that you schedule a follow-up appointment in: ONE YEAR 

## 2013-10-28 ENCOUNTER — Other Ambulatory Visit: Payer: Self-pay

## 2014-10-24 ENCOUNTER — Encounter: Payer: Self-pay | Admitting: Cardiovascular Disease

## 2014-10-24 ENCOUNTER — Ambulatory Visit (INDEPENDENT_AMBULATORY_CARE_PROVIDER_SITE_OTHER): Payer: BC Managed Care – PPO | Admitting: Cardiovascular Disease

## 2014-10-24 VITALS — BP 102/66 | Ht 67.0 in | Wt 246.0 lb

## 2014-10-24 DIAGNOSIS — R002 Palpitations: Secondary | ICD-10-CM

## 2014-10-24 DIAGNOSIS — I10 Essential (primary) hypertension: Secondary | ICD-10-CM

## 2014-10-24 DIAGNOSIS — G4733 Obstructive sleep apnea (adult) (pediatric): Secondary | ICD-10-CM

## 2014-10-24 DIAGNOSIS — I480 Paroxysmal atrial fibrillation: Secondary | ICD-10-CM

## 2014-10-24 DIAGNOSIS — R0789 Other chest pain: Secondary | ICD-10-CM

## 2014-10-24 MED ORDER — AMLODIPINE BESYLATE 5 MG PO TABS: 5.0000 mg | ORAL_TABLET | Freq: Every day | ORAL | Status: DC

## 2014-10-24 MED ORDER — METOPROLOL SUCCINATE ER 25 MG PO TB24: 25.0000 mg | ORAL_TABLET | ORAL | Status: DC | PRN

## 2014-10-24 NOTE — Patient Instructions (Signed)
Your physician wants you to follow-up in: 6 months You will receive a reminder letter in the mail two months in advance. If you don't receive a letter, please call our office to schedule the follow-up appointment.   Your physician has recommended you make the following change in your medication:   STOP Benicar  START Amlodipine 5 mg daily  Take Toprol XL 25 mg daily as needed for palpitations  Your physician has recommended that you wear an event monitor. Event monitors are medical devices that record the heart's electrical activity. Doctors most often [place] these monitors to diagnose arrhythmias. Arrhythmias are problems with the speed or rhythm of the heartbeat. The monitor is a small, portable device. You can wear one while you do your normal daily activities. This is usually used to diagnose what is causing palpitations/syncope (passing out).     Thank you for choosing [facility] !

## 2014-10-24 NOTE — Progress Notes (Signed)
Patient ID: [name], female   DOB: Aug 08, 1951, 63 y.o.   MRN: [id number]      SUBJECTIVE: The patient returns for f/u for paroxysmal atrial fibrillation. A Holter monitor approximately 2.5 years ago demonstrated PVC's with no significant arrhythmias. She experiences what she calls "surges" once every month or every two months. They are associated with chest pressure. It happened yesterday and she felt like she was going to pass out. Each episode lasts 15 seconds, but the chest pressure may last longer. She feels "weak and clammy" when they do occur. She has been trying to take less Celebrex for arthritis and the frequency of palpitations has diminished. She stopped chlorthalidone due to frequent voiding. She wonders if Benicar is causing her stomach to feel upset. She is hearing impaired. She is here with her husband, Shanon Brow.  ECG today demonstrates sinus rhythm with a diffuse nonspecific T wave abnormality.  Review of Systems: As per "subjective", otherwise negative.  Allergies  Allergen Reactions  . Codeine Itching  . Penicillins Nausea And Vomiting  . Sulfonamide Derivatives     REACTION: itching, GI upset    Current Outpatient Prescriptions  Medication Sig Dispense Refill  . ALPRAZolam (XANAX) 0.5 MG tablet Take 0.5 mg by mouth at bedtime as needed.      . celecoxib (CELEBREX) 200 MG capsule Take 200 mg by mouth daily.      . chlorthalidone (HYGROTON) 25 MG tablet Take 0.5 tablets (12.5 mg total) by mouth daily. 15 tablet 3  . fentaNYL (DURAGESIC - DOSED MCG/HR) 50 MCG/HR Place 50 mcg onto the skin every 3 (three) days.    Marland Kitchen FLUoxetine (PROZAC) 40 MG capsule Take 40 mg by mouth daily.      Marland Kitchen glycopyrrolate (ROBINUL) 2 MG tablet Take 2 mg by mouth as needed.     . hydrocodone-acetaminophen (LORCET-HD) 5-500 MG per capsule Take 1 capsule by mouth every 6 (six) hours as needed.      . hyoscyamine (NULEV) 0.125 MG TBDP Place 0.125 mg under the tongue every 4 (four) hours as  needed.     . metoCLOPramide (REGLAN) 5 MG tablet Take 5 mg by mouth as needed.     Marland Kitchen olmesartan (BENICAR) 40 MG tablet Take 1/2 tab daily    . omeprazole (PRILOSEC) 40 MG capsule Take 40 mg by mouth daily.      . promethazine (PHENERGAN) 25 MG tablet Take 0.5 tablets (12.5 mg total) by mouth every 6 (six) hours as needed for nausea. 10 tablet 0   No current facility-administered medications for this visit.    Past Medical History  Diagnosis Date  . Atrial fibrillation   . Degenerative joint disease     Lumbosacral spine; status post right TKA  . Obesity   . Vertigo   . Sleep apnea     treated with CPAP  . Hypertension   . Fibromyalgia   . PUD (peptic ulcer disease)   . Hyperplastic colon polyp   . Diverticulosis   . Anxiety disorder     Past Surgical History  Procedure Laterality Date  . Appendectomy  1968  . Bilroth i procedure  1970s; 2002    s/p Bilroth 1 1970 for peptic ulcer disease;  revision in 2002  . Cholecystectomy  1972  . Laparoscopic tubal ligation    . Total abdominal hysterectomy    . Cataract extraction    . Colonoscopy  08/2010  . Total knee arthroplasty  05/2011    Right  History   Social History  . Marital Status: Married    Spouse Name: N/A    Number of Children: 4  . Years of Education: N/A   Occupational History  . retired     Corporate treasurer   Social History Main Topics  . Smoking status: Never Smoker   . Smokeless tobacco: Never Used  . Alcohol Use: No  . Drug Use: No  . Sexual Activity: Not on file   Other Topics Concern  . Not on file   Social History Narrative     Danley Danker Vitals:   10/24/14 0839  Height: 5\' 7"  (1.702 m)  Weight: 246 lb (111.585 kg)    BP: 102/66 HR: 67  PHYSICAL EXAM General: NAD HEENT: Normal. Neck: No JVD, no thyromegaly. Lungs: Clear to auscultation bilaterally with normal respiratory effort. CV: Nondisplaced PMI.  Regular rate and rhythm, normal S1/S2, no S3/S4, no murmur. No pretibial or periankle  edema.  No carotid bruit.  Normal pedal pulses.  Abdomen: Soft, nontender, no hepatosplenomegaly, no distention.  Neurologic: Alert and oriented x 3.  Psych: Normal affect. Skin: Normal. Musculoskeletal: Normal range of motion, no gross deformities. Extremities: No clubbing or cyanosis.   ECG: Most recent ECG reviewed.      ASSESSMENT AND PLAN: 1. Paroxysmal atrial fibrillation: Unclear whether symptoms represent atrial fibrillation or symptomatic PVC's. Will obtain 30-day event monitor. However, if unable to detect arrhythmia, I would consider implantable loop recorder, which I discussed with the patient and her husband. Will prescribe Toprol-XL 25 mg prn for palpitations. If she has documented recurrence of atrial fibrillation, she would be a candidate for anticoagulation given CHADSVASC score of 2 (HTN, gender). 2. Essential HTN: Will d/c Benicar and start amlodipine 5 mg.  3. Sleep apnea: Compliant with CPAP.  Dispo: f/u 6 months.  Kate Sable, M.D., F.A.C.C.

## 2014-11-22 ENCOUNTER — Other Ambulatory Visit (HOSPITAL_COMMUNITY): Payer: Self-pay | Admitting: Family Medicine

## 2014-11-22 ENCOUNTER — Ambulatory Visit (HOSPITAL_COMMUNITY)
Admission: RE | Admit: 2014-11-22 | Discharge: 2014-11-22 | Disposition: A | Discharge status: Home / Self Care | Payer: BC Managed Care – PPO | Source: Ambulatory Visit | Attending: Family Medicine | Admitting: Family Medicine

## 2014-11-22 DIAGNOSIS — S62306A Unspecified fracture of fifth metacarpal bone, right hand, initial encounter for closed fracture: Secondary | ICD-10-CM | POA: Insufficient documentation

## 2014-11-22 DIAGNOSIS — S6391XA Sprain of unspecified part of right wrist and hand, initial encounter: Secondary | ICD-10-CM

## 2014-11-22 DIAGNOSIS — W19XXXA Unspecified fall, initial encounter: Secondary | ICD-10-CM | POA: Insufficient documentation

## 2014-11-23 ENCOUNTER — Telehealth: Payer: Self-pay | Admitting: *Deleted

## 2014-11-23 NOTE — Telephone Encounter (Signed)
Received EOS report for event monitor. In Dr. Bronson Ing folder

## 2014-12-02 ENCOUNTER — Other Ambulatory Visit: Payer: Self-pay | Admitting: *Deleted

## 2014-12-02 DIAGNOSIS — I480 Paroxysmal atrial fibrillation: Secondary | ICD-10-CM

## 2015-04-03 ENCOUNTER — Encounter: Payer: Self-pay | Admitting: *Deleted

## 2015-04-03 ENCOUNTER — Ambulatory Visit (INDEPENDENT_AMBULATORY_CARE_PROVIDER_SITE_OTHER): Payer: BLUE CROSS/BLUE SHIELD | Admitting: Adult Health

## 2015-04-03 ENCOUNTER — Encounter: Payer: Self-pay | Admitting: Adult Health

## 2015-04-03 VITALS — BP 136/72 | HR 71 | Ht 62.0 in | Wt 252.0 lb

## 2015-04-03 DIAGNOSIS — I10 Essential (primary) hypertension: Secondary | ICD-10-CM | POA: Diagnosis not present

## 2015-04-03 DIAGNOSIS — R072 Precordial pain: Secondary | ICD-10-CM | POA: Diagnosis not present

## 2015-04-03 MED ORDER — NITROGLYCERIN 0.4 MG SL SUBL: 0.4000 mg | SUBLINGUAL_TABLET | SUBLINGUAL | Status: AC | PRN

## 2015-04-03 NOTE — Progress Notes (Signed)
Cardiology Office Note   Date:  04/03/2015   ID:  [name], DOB 1951-11-28, MRN [id number]  PCP:  Purvis Kilts, MD  Cardiologist  Branch/ Jory Sims, NP   Chief Complaint  Patient presents with  . Atrial Fibrillation  . Hypertension      History of Present Illness: [name] is a 64 y.o. female for ongoing assessment and management of paroxysmal atrial for ablation, who was last seen in the office in November 2015, complaining of, what she describes as "surges" once every month or every 2 months. They are associated with chest pressure. She is hearing impaired. A 30 day event monitor was placed on the patient at that time. If unable to detect any arrhythmias, a implantable loop recorder was discussed. For documented recurrence of atrial fibrillation. She would be a candidate for anticoagulation with a CHADS VASC Score of  2. He then monitor revealed sinus rhythm, with a few PVCs. No changes were made on her medication regimen.  She comes today with complaints of chest pain, radiating to her jaw and neck on and off for the last 2-3 weeks. She admits that there have been some family issues with illness and death in the family over the last month. She states she sometimes has to lie down and rest for the pain to go away. She has no trouble swallowing but sometimes feels substernal burning in her chest when she eats.Sje states the pain comes with and without exertion. Not always with emotional stress. Since the pain has been recurrent, she felt that she should be seen. She states that she occasionally has palpitations but does not take the BB prescribed in the past due to trinities.   Past Medical History  Diagnosis Date  . Atrial fibrillation   . Degenerative joint disease     Lumbosacral spine; status post right TKA  . Obesity   . Vertigo   . Sleep apnea     treated with CPAP  . Hypertension   . Fibromyalgia   . PUD (peptic ulcer disease)   . Hyperplastic colon  polyp   . Diverticulosis   . Anxiety disorder     Past Surgical History  Procedure Laterality Date  . Appendectomy  1968  . Bilroth i procedure  1970s; 2002    s/p Bilroth 1 1970 for peptic ulcer disease;  revision in 2002  . Cholecystectomy  1972  . Laparoscopic tubal ligation    . Total abdominal hysterectomy    . Cataract extraction    . Colonoscopy  08/2010  . Total knee arthroplasty  05/2011    Right      Current Outpatient Prescriptions  Medication Sig Dispense Refill  . ALPRAZolam (XANAX) 0.5 MG tablet Take 0.5 mg by mouth at bedtime as needed.      Marland Kitchen amLODipine (NORVASC) 5 MG tablet Take 1 tablet (5 mg total) by mouth daily. 90 tablet 3  . celecoxib (CELEBREX) 200 MG capsule Take 200 mg by mouth daily.      . fentaNYL (DURAGESIC - DOSED MCG/HR) 50 MCG/HR Place 50 mcg onto the skin every 3 (three) days.    Marland Kitchen FLUoxetine (PROZAC) 40 MG capsule Take 40 mg by mouth daily.      Marland Kitchen glycopyrrolate (ROBINUL) 2 MG tablet Take 2 mg by mouth as needed.     . hydrocodone-acetaminophen (LORCET-HD) 5-500 MG per capsule Take 1 capsule by mouth every 6 (six) hours as needed.      Marland Kitchen  hyoscyamine (NULEV) 0.125 MG TBDP Place 0.125 mg under the tongue every 4 (four) hours as needed.     . metoCLOPramide (REGLAN) 5 MG tablet Take 5 mg by mouth as needed.     . metoprolol succinate (TOPROL XL) 25 MG 24 hr tablet Take 1 tablet (25 mg total) by mouth as needed. Take as needed for palpitations 90 tablet 3  . omeprazole (PRILOSEC) 40 MG capsule Take 40 mg by mouth daily.      . promethazine (PHENERGAN) 25 MG tablet Take 0.5 tablets (12.5 mg total) by mouth every 6 (six) hours as needed for nausea. 10 tablet 0   No current facility-administered medications for this visit.    Allergies:   Codeine; Penicillins; and Sulfonamide derivatives    Social History:  The patient  reports that she has never smoked. She has never used smokeless tobacco. She reports that she does not drink alcohol or use illicit  drugs.   Family History:  The patient's family history includes Emphysema in her father.    ROS: .   All other systems are reviewed and negative.Unless otherwise mentioned in H&P above.   PHYSICAL EXAM: VS:  There were no vitals taken for this visit. , BMI There is no weight on file to calculate BMI. GEN: Well nourished, well developed, in no acute distress HEENT: normal Neck: no JVD, carotid bruits, or masses Cardiac: RRR; no murmurs, rubs, or gallops,no edema  Respiratory:  clear to auscultation bilaterally, normal work of breathing GI: soft, nontender, nondistended, + BS MS: no deformity or atrophy Skin: warm and dry, no rash Neuro:  Strength and sensation are intact.Constant tremor. VERY HOH.   Psych: euthymic mood, full affect   Recent Labs: No results found for requested labs within last 365 days.    Lipid Panel    Component Value Date/Time   CHOL 203* 05/25/2012 1136   TRIG 139 05/25/2012 1136   HDL 40 05/25/2012 1136   CHOLHDL 5.1 05/25/2012 1136   VLDL 28 05/25/2012 1136   LDLCALC 135* 05/25/2012 1136      Wt Readings from Last 3 Encounters:  10/24/14 246 lb (111.585 kg)  07/16/13 247 lb 12 oz (112.379 kg)  11/03/12 250 lb (113.399 kg)      Other studies Reviewed: Additional studies/ records that were reviewed today include:None Review of the above records demonstrates:    ASSESSMENT AND PLAN:  1. Recurrent chest pain: Has been seen by Dr. Mathis Bud in the past, [facility] and had a cath 2012  1. Normal left ventricular systolic function with elevated left ventricular  end-diastolic pressure, most probably secondary to hypertensive heart  disease. 2. Moderate pulmonary hypertension probably secondary to obstructive sleep  apnea and morbid obesity. 3. Normal coronary arteries.  I will repeat her stress test. It will be a Lexiscan/cardiolite for symptoms. She has risk factors of FH of premature CAD, Hypertension, Obesity. Hypercholesterolemia. Will  also repeat her echo as she has had elevated pulmonary pressures in the setting of OSA. She is now on CPAP regularly. If tests are normal would recommend GI evaluation.  I  Have given her a Rx for Ntg SL prn and asked her to be sitting down when she takes it.   2. Hypertension:  BP ios well controlled currently. No changes in current regimen.  3.Chronic Pain: Has back issues that are also causing her to have a tremor. She is seeing a neurologist.   Current medicines are reviewed at length with the patient today.  Labs/ tests ordered today include: Lexiscan/Cardiolite, Echo.   No orders of the defined types were placed in this encounter.     Disposition:   FU with after tests to discuss results.   Signed, Jory Sims, NP  04/03/2015 7:32 AM    [facility] [address] [place], [place] [address] Phone: [phone]; Fax: [phone]

## 2015-04-03 NOTE — Patient Instructions (Signed)
Your physician recommends that you schedule a follow-up appointment after ECHO and Stress Test  Your physician has requested that you have a lexiscan myoview. For further information please visit [email or web address]. Please follow instruction sheet, as given.  Your physician has requested that you have an echocardiogram. Echocardiography is a painless test that uses sound waves to create images of your heart. It provides your doctor with information about the size and shape of your heart and how well your heart's chambers and valves are working. This procedure takes approximately one hour. There are no restrictions for this procedure.  Your physician recommends that you continue on your current medications as directed. Please refer to the Current Medication list given to you today.  Thank you for choosing [facility]!

## 2015-04-03 NOTE — Progress Notes (Deleted)
Name: [name]    DOB: [birth year]  Age: 64 y.o.  MR#: [id number]       PCP:  Purvis Kilts, MD      Insurance: Payor: BLUE CROSS BLUE SHIELD MEDICARE / Plan: BCBS MEDICARE / Product Type: *No Product type* /   CC:    Chief Complaint  Patient presents with  . Atrial Fibrillation  . Hypertension    VS Filed Vitals:   04/03/15 1523  BP: 136/72  Pulse: 71  Height: 5\' 2"  (1.575 m)  Weight: 252 lb (114.306 kg)  SpO2: 96%    Weights Current Weight  04/03/15 252 lb (114.306 kg)  10/24/14 246 lb (111.585 kg)  07/16/13 247 lb 12 oz (112.379 kg)    Blood Pressure  BP Readings from Last 3 Encounters:  04/03/15 136/72  10/24/14 102/66  07/16/13 116/68     Admit date:  (Not on file) Last encounter with RMR:  Visit date not found   Allergy Codeine; Penicillins; and Sulfonamide derivatives  Current Outpatient Prescriptions  Medication Sig Dispense Refill  . ALPRAZolam (XANAX) 0.5 MG tablet Take 0.5 mg by mouth at bedtime as needed.      Marland Kitchen amLODipine (NORVASC) 5 MG tablet Take 1 tablet (5 mg total) by mouth daily. 90 tablet 3  . celecoxib (CELEBREX) 200 MG capsule Take 200 mg by mouth daily.      . fentaNYL (DURAGESIC - DOSED MCG/HR) 50 MCG/HR Place 50 mcg onto the skin every 3 (three) days.    Marland Kitchen FLUoxetine (PROZAC) 40 MG capsule Take 40 mg by mouth daily.      Marland Kitchen glycopyrrolate (ROBINUL) 2 MG tablet Take 2 mg by mouth as needed.     . hydrocodone-acetaminophen (LORCET-HD) 5-500 MG per capsule Take 1 capsule by mouth every 6 (six) hours as needed.      . hyoscyamine (NULEV) 0.125 MG TBDP Place 0.125 mg under the tongue every 4 (four) hours as needed.     . metoCLOPramide (REGLAN) 5 MG tablet Take 5 mg by mouth as needed.     Marland Kitchen omeprazole (PRILOSEC) 40 MG capsule Take 40 mg by mouth daily.       No current facility-administered medications for this visit.    Discontinued Meds:    Medications Discontinued During This Encounter  Medication Reason  . metoprolol  succinate (TOPROL XL) 25 MG 24 hr tablet Error  . promethazine (PHENERGAN) 25 MG tablet Error    Patient Active Problem List   Diagnosis Date Noted  . Hyperlipidemia 08/03/2010  . Hypertensive heart disease 08/03/2010  . FIBRILLATION, ATRIAL 08/03/2010  . DYSPNEA 08/03/2010  . COLONIC POLYPS, HX OF 01/01/2010  . OBESITY 09/14/2009  . HYPERTENSION 09/14/2009  . Peptic ulcer disease 09/14/2009  . DEGENERATIVE JOINT DISEASE 09/14/2009  . FIBROMYALGIA 09/14/2009  . Obstructive sleep apnea 09/14/2009    LABS    Component Value Date/Time   NA 138 11/03/2012 0029   NA 141 05/25/2012 1136   NA 137 06/20/2011 0441   K 3.8 11/03/2012 0029   K 4.9 05/25/2012 1136   K 3.8 06/20/2011 0441   CL 97 11/03/2012 0029   CL 102 05/25/2012 1136   CL 101 06/20/2011 0441   CO2 28 11/03/2012 0029   CO2 29 05/25/2012 1136   CO2 29 06/20/2011 0441   GLUCOSE 187* 11/03/2012 0029   GLUCOSE 91 05/25/2012 1136   GLUCOSE 108* 06/20/2011 0441   BUN 24* 11/03/2012 0029   BUN 28* 05/25/2012 1136  BUN 21 06/20/2011 0441   CREATININE 1.17* 11/03/2012 0029   CREATININE 1.10 05/25/2012 1136   CREATININE 1.16* 06/20/2011 0441   CREATININE 1.64* 06/19/2011 1715   CALCIUM 9.9 11/03/2012 0029   CALCIUM 9.6 05/25/2012 1136   CALCIUM 8.4 06/20/2011 0441   GFRNONAA 49* 11/03/2012 0029   GFRNONAA 48* 06/20/2011 0441   GFRNONAA 32* 06/19/2011 1715   GFRAA 57* 11/03/2012 0029   GFRAA 58* 06/20/2011 0441   GFRAA 39* 06/19/2011 1715   CMP     Component Value Date/Time   NA 138 11/03/2012 0029   K 3.8 11/03/2012 0029   CL 97 11/03/2012 0029   CO2 28 11/03/2012 0029   GLUCOSE 187* 11/03/2012 0029   BUN 24* 11/03/2012 0029   CREATININE 1.17* 11/03/2012 0029   CREATININE 1.10 05/25/2012 1136   CALCIUM 9.9 11/03/2012 0029   PROT 8.3 11/03/2012 0029   ALBUMIN 4.0 11/03/2012 0029   AST 19 11/03/2012 0029   ALT 11 11/03/2012 0029   ALKPHOS 113 11/03/2012 0029   BILITOT 0.4 11/03/2012 0029   GFRNONAA  49* 11/03/2012 0029   GFRAA 57* 11/03/2012 0029       Component Value Date/Time   WBC 17.6* 11/03/2012 0029   WBC 10.7* 05/01/2012 1556   WBC 10.3 06/20/2011 0441   HGB 15.3* 11/03/2012 0029   HGB 15.2* 05/01/2012 1556   HGB 9.0* 06/20/2011 0441   HCT 44.8 11/03/2012 0029   HCT 45.2 05/01/2012 1556   HCT 27.7* 06/20/2011 0441   MCV 91.4 11/03/2012 0029   MCV 89.8 05/01/2012 1556   MCV 92.6 06/20/2011 0441    Lipid Panel     Component Value Date/Time   CHOL 203* 05/25/2012 1136   TRIG 139 05/25/2012 1136   HDL 40 05/25/2012 1136   CHOLHDL 5.1 05/25/2012 1136   VLDL 28 05/25/2012 1136   LDLCALC 135* 05/25/2012 1136    ABG No results found for: PHART, PCO2ART, PO2ART, HCO3, TCO2, ACIDBASEDEF, O2SAT   Lab Results  Component Value Date   TSH 1.430 08/14/2010   BNP (last 3 results) No results for input(s): BNP in the last 8760 hours.  ProBNP (last 3 results) No results for input(s): PROBNP in the last 8760 hours.  Cardiac Panel (last 3 results) No results for input(s): CKTOTAL, CKMB, TROPONINI, RELINDX in the last 72 hours.  Iron/TIBC/Ferritin/ %Sat    Component Value Date/Time   IRON 89 01/01/2010 1035   FERRITIN 124.4 01/01/2010 1035   IRONPCTSAT 23.9 01/01/2010 1035     EKG Orders placed or performed in visit on 12/02/14  . Cardiac event monitor     Prior Assessment and Plan Problem List as of 04/03/2015      Cardiovascular and Mediastinum   HYPERTENSION   Last Assessment & Plan 03/25/2011 Office Visit Written 03/25/2011  3:48 PM by Yehuda Savannah, MD    Blood pressure control has improved with weight loss.  Since patient is eager to see a tangible result of her weight reduction efforts, chlorthalidone will be reduced to 12.5 mg q.d.  Patient will continue to monitor blood pressure at home.      Hypertensive heart disease   FIBRILLATION, ATRIAL   Last Assessment & Plan 05/25/2012 Office Visit Written 05/25/2012 12:53 PM by Yehuda Savannah, MD    In the  absence of clinical recurrence of atrial fibrillation and with a relatively low risk or for thromboembolism, full anticoagulation need not be resumed.        Respiratory  Obstructive sleep apnea   Last Assessment & Plan 05/25/2012 Office Visit Edited 05/27/2012 11:06 PM by Yehuda Savannah, MD    Patient has apparently not had a repeat sleep study since her marked weight loss.  Dr. Merlene Laughter may wish to consider performing another test.        Digestive   Peptic ulcer disease     Musculoskeletal and Integument   DEGENERATIVE JOINT DISEASE   FIBROMYALGIA     Other   Hyperlipidemia   Last Assessment & Plan 05/25/2012 Office Visit Written 05/27/2012 11:05 PM by Yehuda Savannah, MD    Lipid profile is suboptimal, but, in the absence of known vascular disease, pharmacologic therapy is not necessary.      OBESITY   Last Assessment & Plan 05/25/2012 Office Visit Written 05/25/2012 12:50 PM by Yehuda Savannah, MD    Patient is congratulated on the weight loss she has achieved overall, although her weight is up 7 pounds since April of 2012.  She is encouraged renew her efforts at further weight loss.      DYSPNEA   Last Assessment & Plan 05/01/2012 Office Visit Written 05/01/2012  5:01 PM by Elsie Stain, MD    Chronic dyspnea with exertion on the basis of restrictive lung disease due to obesity without primary lung disease Patient has had a history of anemia in the past Plan Obtain CBC to evaluate for progressive anemia No additional medication changes No additional pulmonary evaluation      COLONIC POLYPS, HX OF       Imaging: No results found.

## 2015-04-05 ENCOUNTER — Encounter (HOSPITAL_COMMUNITY): Payer: Medicare Other

## 2015-04-05 ENCOUNTER — Other Ambulatory Visit (HOSPITAL_COMMUNITY): Payer: Medicare Other

## 2015-04-05 ENCOUNTER — Encounter (HOSPITAL_COMMUNITY): Admission: RE | Admit: 2015-04-05 | Payer: Medicare Other | Source: Ambulatory Visit

## 2015-04-05 ENCOUNTER — Inpatient Hospital Stay (HOSPITAL_COMMUNITY): Admission: RE | Admit: 2015-04-05 | Payer: Medicare Other | Source: Ambulatory Visit

## 2015-04-07 ENCOUNTER — Ambulatory Visit (HOSPITAL_COMMUNITY)
Admission: RE | Admit: 2015-04-07 | Discharge: 2015-04-07 | Disposition: A | Discharge status: Home / Self Care | Payer: BLUE CROSS/BLUE SHIELD | Source: Ambulatory Visit | Attending: Adult Health | Admitting: Adult Health

## 2015-04-07 ENCOUNTER — Encounter (HOSPITAL_COMMUNITY)
Admission: RE | Admit: 2015-04-07 | Discharge: 2015-04-07 | Disposition: A | Discharge status: Home / Self Care | Payer: BLUE CROSS/BLUE SHIELD | Source: Ambulatory Visit | Attending: Adult Health | Admitting: Adult Health

## 2015-04-07 ENCOUNTER — Encounter (HOSPITAL_COMMUNITY): Payer: Self-pay

## 2015-04-07 DIAGNOSIS — I10 Essential (primary) hypertension: Secondary | ICD-10-CM

## 2015-04-07 DIAGNOSIS — R072 Precordial pain: Secondary | ICD-10-CM | POA: Insufficient documentation

## 2015-04-07 DIAGNOSIS — R079 Chest pain, unspecified: Secondary | ICD-10-CM | POA: Insufficient documentation

## 2015-04-07 MED ORDER — REGADENOSON 0.4 MG/5ML IV SOLN
INTRAVENOUS | Status: AC
  Administered 2015-04-07: 0.4 mg via INTRAVENOUS
  Filled 2015-04-07: qty 5

## 2015-04-07 MED ORDER — TECHNETIUM TC 99M SESTAMIBI GENERIC - CARDIOLITE
10.0000 | Freq: Once | INTRAVENOUS | Status: AC | PRN
  Administered 2015-04-07: 10 via INTRAVENOUS

## 2015-04-07 MED ORDER — TECHNETIUM TC 99M SESTAMIBI - CARDIOLITE
30.0000 | Freq: Once | INTRAVENOUS | Status: AC | PRN
  Administered 2015-04-07: 11:00:00 30 via INTRAVENOUS

## 2015-04-07 MED ORDER — REGADENOSON 0.4 MG/5ML IV SOLN
0.4000 mg | Freq: Once | INTRAVENOUS | Status: AC | PRN
  Administered 2015-04-07: 0.4 mg via INTRAVENOUS

## 2015-04-07 MED ORDER — SODIUM CHLORIDE 0.9 % IJ SOLN
10.0000 mL | INTRAMUSCULAR | Status: DC | PRN
  Administered 2015-04-07: 10 mL via INTRAVENOUS
  Filled 2015-04-07: qty 10

## 2015-04-07 MED ORDER — SODIUM CHLORIDE 0.9 % IJ SOLN
INTRAMUSCULAR | Status: AC
  Administered 2015-04-07: 10 mL via INTRAVENOUS
  Filled 2015-04-07: qty 3

## 2015-04-07 NOTE — Progress Notes (Signed)
  Echocardiogram 2D Echocardiogram has been performed.  [name] 04/07/2015, 11:14 AM

## 2015-04-07 NOTE — Progress Notes (Signed)
Stress Lab Nurses Notes - [facility]  [name] 04/07/2015 Reason for doing test: Chest Pain Type of test: Wille Glaser Nurse performing test: Gerrit Halls, RN Nuclear Medicine Tech: [name] Echo Tech: Not Applicable MD performing test: S. McDowell/K.Purcell Nails NP Family MD: Hilma Favors Test explained and consent signed: Yes.   IV started: Saline lock flushed, No redness or edema and Saline lock started in radiology Symptoms: discomfort in ears & stomach discomfort Treatment/Intervention: None Reason test stopped: protocol completed After recovery IV was: Discontinued via X-ray tech and No redness or edema Patient to return to Nuc. Med at : 11:30 Patient discharged: Home Patient's Condition upon discharge was: stable Comments: During test BP 130/63 & HR 89.  Recovery BP 125/76 & HR 68.  Symptoms resolved in recovery. [name] T

## 2015-04-10 ENCOUNTER — Other Ambulatory Visit: Payer: Self-pay | Admitting: Adult Health

## 2015-04-10 ENCOUNTER — Encounter: Payer: Self-pay | Admitting: *Deleted

## 2015-04-10 ENCOUNTER — Encounter: Payer: Self-pay | Admitting: Adult Health

## 2015-04-10 ENCOUNTER — Ambulatory Visit (INDEPENDENT_AMBULATORY_CARE_PROVIDER_SITE_OTHER): Payer: BLUE CROSS/BLUE SHIELD | Admitting: Adult Health

## 2015-04-10 ENCOUNTER — Ambulatory Visit (HOSPITAL_COMMUNITY)
Admission: RE | Admit: 2015-04-10 | Discharge: 2015-04-10 | Disposition: A | Discharge status: Home / Self Care | Payer: BLUE CROSS/BLUE SHIELD | Source: Ambulatory Visit | Attending: Adult Health | Admitting: Adult Health

## 2015-04-10 VITALS — BP 130/76 | HR 63 | Ht 62.0 in | Wt 250.0 lb

## 2015-04-10 DIAGNOSIS — R9439 Abnormal result of other cardiovascular function study: Secondary | ICD-10-CM

## 2015-04-10 DIAGNOSIS — Z01811 Encounter for preprocedural respiratory examination: Secondary | ICD-10-CM

## 2015-04-10 DIAGNOSIS — I209 Angina pectoris, unspecified: Secondary | ICD-10-CM | POA: Diagnosis not present

## 2015-04-10 DIAGNOSIS — I10 Essential (primary) hypertension: Secondary | ICD-10-CM | POA: Diagnosis not present

## 2015-04-10 DIAGNOSIS — R079 Chest pain, unspecified: Secondary | ICD-10-CM | POA: Diagnosis not present

## 2015-04-10 NOTE — Progress Notes (Deleted)
Name: [name]    DOB: 27-Aug-1951  Age: 64 y.o.  MR#: [id number]       PCP:  Purvis Kilts, MD      Insurance: Payor: BLUE CROSS BLUE SHIELD MEDICARE / Plan: BCBS MEDICARE / Product Type: *No Product type* /   CC:    Chief Complaint  Patient presents with  . Atrial Fibrillation  . Chest Pain    VS Filed Vitals:   04/10/15 1445  BP: 130/76  Pulse: 63  Height: 5\' 2"  (1.575 m)  Weight: 250 lb (113.399 kg)    Weights Current Weight  04/10/15 250 lb (113.399 kg)  04/03/15 252 lb (114.306 kg)  10/24/14 246 lb (111.585 kg)    Blood Pressure  BP Readings from Last 3 Encounters:  04/10/15 130/76  04/03/15 136/72  10/24/14 102/66     Admit date:  (Not on file) Last encounter with RMR:  04/03/2015   Allergy Codeine; Penicillins; and Sulfonamide derivatives  Current Outpatient Prescriptions  Medication Sig Dispense Refill  . ALPRAZolam (XANAX) 0.5 MG tablet Take 0.5 mg by mouth at bedtime as needed.      Marland Kitchen amLODipine (NORVASC) 5 MG tablet Take 1 tablet (5 mg total) by mouth daily. 90 tablet 3  . celecoxib (CELEBREX) 200 MG capsule Take 200 mg by mouth daily.      . fentaNYL (DURAGESIC - DOSED MCG/HR) 50 MCG/HR Place 50 mcg onto the skin every 3 (three) days.    Marland Kitchen FLUoxetine (PROZAC) 40 MG capsule Take 40 mg by mouth daily.      Marland Kitchen glycopyrrolate (ROBINUL) 2 MG tablet Take 2 mg by mouth as needed.     . hydrocodone-acetaminophen (LORCET-HD) 5-500 MG per capsule Take 1 capsule by mouth every 6 (six) hours as needed.      . hyoscyamine (NULEV) 0.125 MG TBDP Place 0.125 mg under the tongue every 4 (four) hours as needed.     . metoCLOPramide (REGLAN) 5 MG tablet Take 5 mg by mouth as needed.     . nitroGLYCERIN (NITROSTAT) 0.4 MG SL tablet Place 1 tablet (0.4 mg total) under the tongue every 5 (five) minutes as needed for chest pain. 25 tablet 3  . omeprazole (PRILOSEC) 40 MG capsule Take 40 mg by mouth as needed.      No current facility-administered medications for this  visit.    Discontinued Meds:   There are no discontinued medications.  Patient Active Problem List   Diagnosis Date Noted  . Hyperlipidemia 08/03/2010  . Hypertensive heart disease 08/03/2010  . FIBRILLATION, ATRIAL 08/03/2010  . DYSPNEA 08/03/2010  . COLONIC POLYPS, HX OF 01/01/2010  . OBESITY 09/14/2009  . HYPERTENSION 09/14/2009  . Peptic ulcer disease 09/14/2009  . DEGENERATIVE JOINT DISEASE 09/14/2009  . FIBROMYALGIA 09/14/2009  . Obstructive sleep apnea 09/14/2009    LABS    Component Value Date/Time   NA 138 11/03/2012 0029   NA 141 05/25/2012 1136   NA 137 06/20/2011 0441   K 3.8 11/03/2012 0029   K 4.9 05/25/2012 1136   K 3.8 06/20/2011 0441   CL 97 11/03/2012 0029   CL 102 05/25/2012 1136   CL 101 06/20/2011 0441   CO2 28 11/03/2012 0029   CO2 29 05/25/2012 1136   CO2 29 06/20/2011 0441   GLUCOSE 187* 11/03/2012 0029   GLUCOSE 91 05/25/2012 1136   GLUCOSE 108* 06/20/2011 0441   BUN 24* 11/03/2012 0029   BUN 28* 05/25/2012 1136   BUN 21  06/20/2011 0441   CREATININE 1.17* 11/03/2012 0029   CREATININE 1.10 05/25/2012 1136   CREATININE 1.16* 06/20/2011 0441   CREATININE 1.64* 06/19/2011 1715   CALCIUM 9.9 11/03/2012 0029   CALCIUM 9.6 05/25/2012 1136   CALCIUM 8.4 06/20/2011 0441   GFRNONAA 49* 11/03/2012 0029   GFRNONAA 48* 06/20/2011 0441   GFRNONAA 32* 06/19/2011 1715   GFRAA 57* 11/03/2012 0029   GFRAA 58* 06/20/2011 0441   GFRAA 39* 06/19/2011 1715   CMP     Component Value Date/Time   NA 138 11/03/2012 0029   K 3.8 11/03/2012 0029   CL 97 11/03/2012 0029   CO2 28 11/03/2012 0029   GLUCOSE 187* 11/03/2012 0029   BUN 24* 11/03/2012 0029   CREATININE 1.17* 11/03/2012 0029   CREATININE 1.10 05/25/2012 1136   CALCIUM 9.9 11/03/2012 0029   PROT 8.3 11/03/2012 0029   ALBUMIN 4.0 11/03/2012 0029   AST 19 11/03/2012 0029   ALT 11 11/03/2012 0029   ALKPHOS 113 11/03/2012 0029   BILITOT 0.4 11/03/2012 0029   GFRNONAA 49* 11/03/2012 0029    GFRAA 57* 11/03/2012 0029       Component Value Date/Time   WBC 17.6* 11/03/2012 0029   WBC 10.7* 05/01/2012 1556   WBC 10.3 06/20/2011 0441   HGB 15.3* 11/03/2012 0029   HGB 15.2* 05/01/2012 1556   HGB 9.0* 06/20/2011 0441   HCT 44.8 11/03/2012 0029   HCT 45.2 05/01/2012 1556   HCT 27.7* 06/20/2011 0441   MCV 91.4 11/03/2012 0029   MCV 89.8 05/01/2012 1556   MCV 92.6 06/20/2011 0441    Lipid Panel     Component Value Date/Time   CHOL 203* 05/25/2012 1136   TRIG 139 05/25/2012 1136   HDL 40 05/25/2012 1136   CHOLHDL 5.1 05/25/2012 1136   VLDL 28 05/25/2012 1136   LDLCALC 135* 05/25/2012 1136    ABG No results found for: PHART, PCO2ART, PO2ART, HCO3, TCO2, ACIDBASEDEF, O2SAT   Lab Results  Component Value Date   TSH 1.430 08/14/2010   BNP (last 3 results) No results for input(s): BNP in the last 8760 hours.  ProBNP (last 3 results) No results for input(s): PROBNP in the last 8760 hours.  Cardiac Panel (last 3 results) No results for input(s): CKTOTAL, CKMB, TROPONINI, RELINDX in the last 72 hours.  Iron/TIBC/Ferritin/ %Sat    Component Value Date/Time   IRON 89 01/01/2010 1035   FERRITIN 124.4 01/01/2010 1035   IRONPCTSAT 23.9 01/01/2010 1035     EKG Orders placed or performed in visit on 12/02/14  . Cardiac event monitor     Prior Assessment and Plan Problem List as of 04/10/2015      Cardiovascular and Mediastinum   HYPERTENSION   Last Assessment & Plan 03/25/2011 Office Visit Written 03/25/2011  3:48 PM by Yehuda Savannah, MD    Blood pressure control has improved with weight loss.  Since patient is eager to see a tangible result of her weight reduction efforts, chlorthalidone Dana be reduced to 12.5 mg q.d.  Patient Dana continue to monitor blood pressure at home.      Hypertensive heart disease   FIBRILLATION, ATRIAL   Last Assessment & Plan 05/25/2012 Office Visit Written 05/25/2012 12:53 PM by Yehuda Savannah, MD    In the absence of clinical  recurrence of atrial fibrillation and with a relatively low risk or for thromboembolism, full anticoagulation need not be resumed.        Respiratory  Obstructive sleep apnea   Last Assessment & Plan 05/25/2012 Office Visit Edited 05/27/2012 11:06 PM by Yehuda Savannah, MD    Patient has apparently not had a repeat sleep study since her marked weight loss.  Dr. Merlene Laughter may wish to consider performing another test.        Digestive   Peptic ulcer disease     Musculoskeletal and Integument   DEGENERATIVE JOINT DISEASE   FIBROMYALGIA     Other   Hyperlipidemia   Last Assessment & Plan 05/25/2012 Office Visit Written 05/27/2012 11:05 PM by Yehuda Savannah, MD    Lipid profile is suboptimal, but, in the absence of known vascular disease, pharmacologic therapy is not necessary.      OBESITY   Last Assessment & Plan 05/25/2012 Office Visit Written 05/25/2012 12:50 PM by Yehuda Savannah, MD    Patient is congratulated on the weight loss she has achieved overall, although her weight is up 7 pounds since April of 2012.  She is encouraged renew her efforts at further weight loss.      DYSPNEA   Last Assessment & Plan 05/01/2012 Office Visit Written 05/01/2012  5:01 PM by Elsie Stain, MD    Chronic dyspnea with exertion on the basis of restrictive lung disease due to obesity without primary lung disease Patient has had a history of anemia in the past Plan Obtain CBC to evaluate for progressive anemia No additional medication changes No additional pulmonary evaluation      COLONIC POLYPS, HX OF       Imaging: No results found.

## 2015-04-10 NOTE — Progress Notes (Signed)
Cardiology Office Note   Date:  04/10/2015   ID:  [name], DOB 03-23-1951, MRN [id number]  PCP:  Purvis Kilts, MD  Cardiologist:  Cloria Spring, NP   Chief Complaint  Patient presents with  . Atrial Fibrillation  . Chest Pain      History of Present Illness: [name] is a 64 y.o. female who presents for ongoing assessment and management of paroxysmal atrial fibrillation,with history of obstructive sleep apnea, hypertension, and multiple noncardiac issues.  She was last seen in the office on 04/02/2005 with complaints of chest pain, radiating to her jaw and neck on off for the last to 3 weeks.  She had some family stress with the death of a loved one.  A LexiScan Cardiolite stress test was ordered for evaluation of chest discomfort.  Blood pressure was well controlled.  LexiScan Cardiolite was completed on 4 11 2016, and found to be high risk.  He revealed large, moderate intensity, nearly completely reversible anterior defect, medium sized, moderate intensity, partially reversible inferior lateral defect.  EF 83%.  No wall motion abnormalities.  She admits to feeling chest pressure, one evening, waking her up. She had mild shortness of breath.  She has obstructive sleep apnea, and has use of CPAP, which she is compliant with.  She has not had to take nitroglycerin.    Past Medical History  Diagnosis Date  . Atrial fibrillation   . Degenerative joint disease     Lumbosacral spine; status post right TKA  . Obesity   . Vertigo   . Sleep apnea     treated with CPAP  . Hypertension   . Fibromyalgia   . PUD (peptic ulcer disease)   . Hyperplastic colon polyp   . Diverticulosis   . Anxiety disorder     Past Surgical History  Procedure Laterality Date  . Appendectomy  1968  . Bilroth i procedure  1970s; 2002    s/p Bilroth 1 1970 for peptic ulcer disease;  revision in 2002  . Cholecystectomy  1972  . Laparoscopic tubal ligation    . Total  abdominal hysterectomy    . Cataract extraction    . Colonoscopy  08/2010  . Total knee arthroplasty  05/2011    Right      Current Outpatient Prescriptions  Medication Sig Dispense Refill  . ALPRAZolam (XANAX) 0.5 MG tablet Take 0.5 mg by mouth at bedtime as needed.      Marland Kitchen amLODipine (NORVASC) 5 MG tablet Take 1 tablet (5 mg total) by mouth daily. 90 tablet 3  . celecoxib (CELEBREX) 200 MG capsule Take 200 mg by mouth daily.      . fentaNYL (DURAGESIC - DOSED MCG/HR) 50 MCG/HR Place 50 mcg onto the skin every 3 (three) days.    Marland Kitchen FLUoxetine (PROZAC) 40 MG capsule Take 40 mg by mouth daily.      Marland Kitchen glycopyrrolate (ROBINUL) 2 MG tablet Take 2 mg by mouth as needed.     . hydrocodone-acetaminophen (LORCET-HD) 5-500 MG per capsule Take 1 capsule by mouth every 6 (six) hours as needed.      . hyoscyamine (NULEV) 0.125 MG TBDP Place 0.125 mg under the tongue every 4 (four) hours as needed.     . metoCLOPramide (REGLAN) 5 MG tablet Take 5 mg by mouth as needed.     . nitroGLYCERIN (NITROSTAT) 0.4 MG SL tablet Place 1 tablet (0.4 mg total) under the tongue every 5 (five) minutes as needed  for chest pain. 25 tablet 3  . omeprazole (PRILOSEC) 40 MG capsule Take 40 mg by mouth as needed.      No current facility-administered medications for this visit.    Allergies:   Codeine; Penicillins; and Sulfonamide derivatives    Social History:  The patient  reports that she has never smoked. She has never used smokeless tobacco. She reports that she does not drink alcohol or use illicit drugs.   Family History:  The patient's family history includes Emphysema in her father.    ROS: .   All other systems are reviewed and negative.Unless otherwise mentioned in H&P above.   PHYSICAL EXAM: VS:  BP 130/76 mmHg  Pulse 63  Ht 5\' 2"  (1.575 m)  Wt 250 lb (113.399 kg)  BMI 45.71 kg/m2 , BMI Body mass index is 45.71 kg/(m^2). GEN: Well nourished, well developed, in no acute distress HEENT: normal Neck:  no JVD, carotid bruits, or masses Cardiac: RRR; no murmurs, rubs, or gallops,no edema  Respiratory:  clear to auscultation bilaterally, normal work of breathing GI: soft, nontender, nondistended, + BS MS: no deformity or atrophy Skin: warm and dry, no rash Neuro:  Strength and sensation are intact. Chronic tremor Psych: euthymic mood, full affect   EKG: normal sinus rhythm, with nonspecific T-wave abnormality anteriorly, laterally, with some T wave flattening noted in III. Rate of 64 beats per minute.  Recent Labs: No results found for requested labs within last 365 days.    Lipid Panel    Component Value Date/Time   CHOL 203* 05/25/2012 1136   TRIG 139 05/25/2012 1136   HDL 40 05/25/2012 1136   CHOLHDL 5.1 05/25/2012 1136   VLDL 28 05/25/2012 1136   LDLCALC 135* 05/25/2012 1136      Wt Readings from Last 3 Encounters:  04/10/15 250 lb (113.399 kg)  04/03/15 252 lb (114.306 kg)  10/24/14 246 lb (111.585 kg)      Other studies Reviewed: Additional studies/ records that were reviewed today include: Lexiscan/ Cardiolite stress test Review of the above records demonstrates: Large moderate intensity, nearly completely reversible anterior defect, medium sized, moderate intensity partially reversible inferior lateral defect, EF of 83%, high-risk study without wall motion abnormalities   ASSESSMENT AND PLAN:  1.Abnormal LexiScan Cardiolite: The test is been reviewed by Dr. Carlyle Dolly, onsite.  He is in agreement that the patient needs to be planned for cardiac catheterization and possible PCI.I have explained the test results to the patient, using a heart model to describe her anatomy and filling defect.  I have spoken with her at length about cardiac catheterization to include risks and benefits.  She verbalizes understanding.  She is willing to proceed.  Cardiac catheterization will be completed on Friday, [place], 2016.  I have advised her, if chest pressure worsens, it  becomes unrelenting, she is to proceed to the nearest emergency room, where she will likely be transferred to come in and have cardiac catheterization sooner than scheduled date.  She is advised to have a bottle of nitroglycerin with her at all times and be sitting down when she uses the medication to prevent hypotension.  She verbalizes understanding.   2. DEAF Patient: The patient reads lips,and has partial hearing in her left ear.  3. Hypertension: she is to continue amlodipine, as recommended, as this is well controlled.  Blood pressure of 130/76 in the office.   4. Anxiety: She is advised to use Xanax when necessary as directed.  5. Chronic Tremor:  She has a chronic tremor related to nerve damage from degenerative disc disease.  Current medicines are reviewed at length with the patient today.    Labs/ tests ordered today include: pre-cardiac catheterization orders. No orders of the defined types were placed in this encounter.     Disposition:   FU post hospitalization after catheterization. Signed, Jory Sims, NP  04/10/2015 3:15 PM    [facility] [address], [place], [place] [address] Phone: [phone]; Fax: [phone]

## 2015-04-10 NOTE — Patient Instructions (Signed)
Your physician has requested that you have a cardiac catheterization on 04/14/15. Cardiac catheterization is used to diagnose and/or treat various heart conditions. Doctors may recommend this procedure for a number of different reasons. The most common reason is to evaluate chest pain. Chest pain can be a symptom of coronary artery disease (CAD), and cardiac catheterization can show whether plaque is narrowing or blocking your heart's arteries. This procedure is also used to evaluate the valves, as well as measure the blood flow and oxygen levels in different parts of your heart. For further information please visit [email or web address]. Please follow instruction sheet, as given.  Your physician recommends that you continue on your current medications as directed. Please refer to the Current Medication list given to you today.  Thank you for choosing [facility]!

## 2015-04-11 LAB — CBC WITH DIFFERENTIAL/PLATELET
Basophils Absolute: 0 10*3/uL (ref 0.0–0.1)
Basophils Relative: 0 % (ref 0–1)
Eosinophils Absolute: 0 10*3/uL (ref 0.0–0.7)
Eosinophils Relative: 0 % (ref 0–5)
HCT: 44.7 % (ref 36.0–46.0)
Hemoglobin: 14.7 g/dL (ref 12.0–15.0)
LYMPHS ABS: 2.7 10*3/uL (ref 0.7–4.0)
LYMPHS PCT: 29 % (ref 12–46)
MCH: 29.5 pg (ref 26.0–34.0)
MCHC: 32.9 g/dL (ref 30.0–36.0)
MCV: 89.8 fL (ref 78.0–100.0)
MPV: 10.1 fL (ref 8.6–12.4)
Monocytes Absolute: 0.6 10*3/uL (ref 0.1–1.0)
Monocytes Relative: 6 % (ref 3–12)
NEUTROS PCT: 65 % (ref 43–77)
Neutro Abs: 6 10*3/uL (ref 1.7–7.7)
Platelets: 197 10*3/uL (ref 150–400)
RBC: 4.98 MIL/uL (ref 3.87–5.11)
RDW: 13.8 % (ref 11.5–15.5)
WBC: 9.3 10*3/uL (ref 4.0–10.5)

## 2015-04-11 LAB — BASIC METABOLIC PANEL
BUN: 16 mg/dL (ref 6–23)
CALCIUM: 8.8 mg/dL (ref 8.4–10.5)
CO2: 28 mEq/L (ref 19–32)
CREATININE: 0.81 mg/dL (ref 0.50–1.10)
Chloride: 101 mEq/L (ref 96–112)
Glucose, Bld: 83 mg/dL (ref 70–99)
Potassium: 4.1 mEq/L (ref 3.5–5.3)
SODIUM: 138 meq/L (ref 135–145)

## 2015-04-11 LAB — PROTIME-INR
INR: 1.02 (ref <1.50)
PROTHROMBIN TIME: 13.4 s (ref 11.6–15.2)

## 2015-04-14 ENCOUNTER — Ambulatory Visit (HOSPITAL_COMMUNITY)
Admission: RE | Admit: 2015-04-14 | Discharge: 2015-04-14 | Disposition: A | Discharge status: Home / Self Care | Payer: BLUE CROSS/BLUE SHIELD | Source: Ambulatory Visit | Attending: Interventional Cardiology | Admitting: Interventional Cardiology

## 2015-04-14 ENCOUNTER — Encounter (HOSPITAL_COMMUNITY)
Admission: RE | Disposition: A | Discharge status: Home / Self Care | Payer: Self-pay | Source: Ambulatory Visit | Attending: Interventional Cardiology

## 2015-04-14 ENCOUNTER — Encounter (HOSPITAL_COMMUNITY): Payer: Self-pay | Admitting: Interventional Cardiology

## 2015-04-14 DIAGNOSIS — Z9849 Cataract extraction status, unspecified eye: Secondary | ICD-10-CM | POA: Insufficient documentation

## 2015-04-14 DIAGNOSIS — I10 Essential (primary) hypertension: Secondary | ICD-10-CM | POA: Insufficient documentation

## 2015-04-14 DIAGNOSIS — R079 Chest pain, unspecified: Secondary | ICD-10-CM | POA: Diagnosis present

## 2015-04-14 DIAGNOSIS — Z6841 Body Mass Index (BMI) 40.0 and over, adult: Secondary | ICD-10-CM | POA: Diagnosis not present

## 2015-04-14 DIAGNOSIS — G4733 Obstructive sleep apnea (adult) (pediatric): Secondary | ICD-10-CM | POA: Diagnosis not present

## 2015-04-14 DIAGNOSIS — Z86010 Personal history of colonic polyps: Secondary | ICD-10-CM | POA: Insufficient documentation

## 2015-04-14 DIAGNOSIS — R9439 Abnormal result of other cardiovascular function study: Secondary | ICD-10-CM | POA: Diagnosis not present

## 2015-04-14 DIAGNOSIS — Z8711 Personal history of peptic ulcer disease: Secondary | ICD-10-CM | POA: Insufficient documentation

## 2015-04-14 DIAGNOSIS — E669 Obesity, unspecified: Secondary | ICD-10-CM | POA: Insufficient documentation

## 2015-04-14 DIAGNOSIS — Z90710 Acquired absence of both cervix and uterus: Secondary | ICD-10-CM | POA: Diagnosis not present

## 2015-04-14 DIAGNOSIS — I480 Paroxysmal atrial fibrillation: Secondary | ICD-10-CM | POA: Insufficient documentation

## 2015-04-14 DIAGNOSIS — F419 Anxiety disorder, unspecified: Secondary | ICD-10-CM | POA: Diagnosis not present

## 2015-04-14 DIAGNOSIS — M5135 Other intervertebral disc degeneration, thoracolumbar region: Secondary | ICD-10-CM | POA: Diagnosis not present

## 2015-04-14 DIAGNOSIS — R0789 Other chest pain: Secondary | ICD-10-CM | POA: Diagnosis not present

## 2015-04-14 DIAGNOSIS — Z96651 Presence of right artificial knee joint: Secondary | ICD-10-CM | POA: Insufficient documentation

## 2015-04-14 DIAGNOSIS — Z9049 Acquired absence of other specified parts of digestive tract: Secondary | ICD-10-CM | POA: Insufficient documentation

## 2015-04-14 HISTORY — PX: LEFT HEART CATHETERIZATION WITH CORONARY ANGIOGRAM: SHX5451

## 2015-04-14 SURGERY — LEFT HEART CATHETERIZATION WITH CORONARY ANGIOGRAM
Anesthesia: LOCAL

## 2015-04-14 MED ORDER — VERAPAMIL HCL 2.5 MG/ML IV SOLN
INTRAVENOUS | Status: AC
Start: 2015-04-14 — End: 2015-04-14
  Filled 2015-04-14: qty 2

## 2015-04-14 MED ORDER — SODIUM CHLORIDE 0.9 % IJ SOLN: 3.0000 mL | Freq: Two times a day (BID) | INTRAMUSCULAR | Status: DC

## 2015-04-14 MED ORDER — ACETAMINOPHEN 325 MG PO TABS
650.0000 mg | ORAL_TABLET | ORAL | Status: DC | PRN
Start: 2015-04-14 — End: 2015-04-14

## 2015-04-14 MED ORDER — MIDAZOLAM HCL 2 MG/2ML IJ SOLN
INTRAMUSCULAR | Status: AC
  Filled 2015-04-14: qty 2

## 2015-04-14 MED ORDER — ONDANSETRON HCL 4 MG/2ML IJ SOLN: 4.0000 mg | Freq: Four times a day (QID) | INTRAMUSCULAR | Status: DC | PRN

## 2015-04-14 MED ORDER — SODIUM CHLORIDE 0.9 % IV SOLN: 1.0000 mL/kg/h | INTRAVENOUS | Status: DC

## 2015-04-14 MED ORDER — SODIUM CHLORIDE 0.9 % IV SOLN
INTRAVENOUS | Status: DC
  Administered 2015-04-14: 06:00:00 via INTRAVENOUS

## 2015-04-14 MED ORDER — SODIUM CHLORIDE 0.9 % IJ SOLN: 3.0000 mL | INTRAMUSCULAR | Status: DC | PRN

## 2015-04-14 MED ORDER — LIDOCAINE HCL (PF) 1 % IJ SOLN
INTRAMUSCULAR | Status: AC
  Filled 2015-04-14: qty 30

## 2015-04-14 MED ORDER — HEPARIN (PORCINE) IN NACL 2-0.9 UNIT/ML-% IJ SOLN
INTRAMUSCULAR | Status: AC
  Filled 2015-04-14: qty 1000

## 2015-04-14 MED ORDER — HEPARIN SODIUM (PORCINE) 1000 UNIT/ML IJ SOLN
INTRAMUSCULAR | Status: AC
  Filled 2015-04-14: qty 1

## 2015-04-14 MED ORDER — NITROGLYCERIN 1 MG/10 ML FOR IR/CATH LAB
INTRA_ARTERIAL | Status: AC
  Filled 2015-04-14: qty 10

## 2015-04-14 MED ORDER — SODIUM CHLORIDE 0.9 % IV SOLN: 250.0000 mL | INTRAVENOUS | Status: DC | PRN

## 2015-04-14 MED ORDER — FENTANYL CITRATE (PF) 100 MCG/2ML IJ SOLN
INTRAMUSCULAR | Status: AC
  Filled 2015-04-14: qty 2

## 2015-04-14 MED ORDER — ASPIRIN 81 MG PO CHEW: 81.0000 mg | CHEWABLE_TABLET | ORAL | Status: DC

## 2015-04-14 NOTE — Interval H&P Note (Signed)
Cath Lab Visit (complete for each Cath Lab visit)  Clinical Evaluation Leading to the Procedure:   ACS: No.  Non-ACS:    Anginal Classification: CCS III  Anti-ischemic medical therapy: Maximal Therapy (2 or more classes of medications)  Non-Invasive Test Results: High-risk stress test findings: cardiac mortality >3%/year  Prior CABG: No previous CABG      History and Physical Interval Note:  04/14/2015 7:08 AM  [name]  has presented today for surgery, with the diagnosis of abn stress  The various methods of treatment have been discussed with the patient and family. After consideration of risks, benefits and other options for treatment, the patient has consented to  Procedure(s): LEFT HEART CATHETERIZATION WITH CORONARY ANGIOGRAM (N/A) as a surgical intervention .  The patient's history has been reviewed, patient examined, no change in status, stable for surgery.  I have reviewed the patient's chart and labs.  Questions were answered to the patient's satisfaction.     Sinclair Grooms

## 2015-04-14 NOTE — CV Procedure (Signed)
     Left Heart Catheterization with Coronary Angiography Report  [name]  64 y.o.  female [birth year]  Procedure Date: 04/14/2015 Referring Physician: Jory Sims, NP Primary Cardiologist: Kate Sable, MD  INDICATIONS: 64 year old female with atypical chest pain and high risk myocardial perfusion study suggesting anterior ischemia.  PROCEDURE: 1. Left heart catheterization; 2. Coronary angiogram; 3. Left ventriculography  CONSENT:  The risks, benefits, and details of the procedure were explained in detail to the patient. Risks including death, stroke, heart attack, kidney injury, allergy, limb ischemia, bleeding and radiation injury were discussed.  The patient verbalized understanding and wanted to proceed.  Informed written consent was obtained.  PROCEDURE TECHNIQUE:  After Xylocaine anesthesia a 5 French Slender sheath was placed in the right radial artery with an angiocath and the modified Seldinger technique.  Coronary angiography was done using a 5 F JR 4 and JL 3.5 cm catheter.  Left ventriculography was done using the JR 4 catheter and hand injection.   Hemostasis with a wristband using 7 cc of air at 8:17A.   CONTRAST:  Total of 70 cc.  COMPLICATIONS:  None   HEMODYNAMICS:  Aortic pressure 132/70 mmHg; LV pressure 132/3 mmHg; LVEDP 17 mmHg  ANGIOGRAPHIC DATA:   The left main coronary artery is normal.  The left anterior descending artery is normal and tortuous. The LAD stops at the apex..  The left circumflex artery is normal. 2 obtuse marginal branches arise and supply the lateral wall..  The ramus intermedius is normal. The vessel is tortuous.  The right coronary artery is dominant and normal..   LEFT VENTRICULOGRAM:  Left ventricular angiogram was done in the 30 RAO projection and revealed no abnormality. Cavity size is normal. EF is 65-70%   IMPRESSIONS:  1. Widely patent coronary arteries. 2. Normal left ventricular systolic function and  hemodynamics 3. False positive myocardial perfusion study   RECOMMENDATION:  Entertain other explanations for the patient's chest pain.Marland Kitchen

## 2015-04-14 NOTE — Discharge Instructions (Signed)
Radial Site Care °Refer to this sheet in the next few weeks. These instructions provide you with information on caring for yourself after your procedure. Your caregiver may also give you more specific instructions. Your treatment has been planned according to current medical practices, but problems sometimes occur. Call your caregiver if you have any problems or questions after your procedure. °HOME CARE INSTRUCTIONS °· You may shower the day after the procedure. Remove the bandage (dressing) and gently wash the site with plain soap and water. Gently pat the site dry. °· Do not apply powder or lotion to the site. °· Do not submerge the affected site in water for 3 to 5 days. °· Inspect the site at least twice daily. °· Do not flex or bend the affected arm for 24 hours. °· No lifting over 5 pounds (2.3 kg) for 5 days after your procedure. °· Do not drive home if you are discharged the same day of the procedure. Have someone else drive you. °· You may drive 24 hours after the procedure unless otherwise instructed by your caregiver. °· Do not operate machinery or power tools for 24 hours. °· A responsible adult should be with you for the first 24 hours after you arrive home. °What to expect: °· Any bruising will usually fade within 1 to 2 weeks. °· Blood that collects in the tissue (hematoma) may be painful to the touch. It should usually decrease in size and tenderness within 1 to 2 weeks. °SEEK IMMEDIATE MEDICAL CARE IF: °· You have unusual pain at the radial site. °· You have redness, warmth, swelling, or pain at the radial site. °· You have drainage (other than a small amount of blood on the dressing). °· You have chills. °· You have a fever or persistent symptoms for more than 72 hours. °· You have a fever and your symptoms suddenly get worse. °· Your arm becomes pale, cool, tingly, or numb. °· You have heavy bleeding from the site. Hold pressure on the site. °Document Released: 01/11/2011 Document Revised:  03/02/2012 Document Reviewed: 01/11/2011 °ExitCare® Patient Information ©2015 [employer]. This information is not intended to replace advice given to you by your health care provider. Make sure you discuss any questions you have with your health care provider. ° °

## 2015-04-14 NOTE — H&P (View-Only) (Signed)
Cardiology Office Note   Date:  04/10/2015   ID:  [name], DOB 1951/08/29, MRN [id number]  PCP:  Purvis Kilts, MD  Cardiologist:  Cloria Spring, NP   Chief Complaint  Patient presents with  . Atrial Fibrillation  . Chest Pain      History of Present Illness: [name] is a 64 y.o. female who presents for ongoing assessment and management of paroxysmal atrial fibrillation,with history of obstructive sleep apnea, hypertension, and multiple noncardiac issues.  She was last seen in the office on 04/02/2005 with complaints of chest pain, radiating to her jaw and neck on off for the last to 3 weeks.  She had some family stress with the death of a loved one.  A LexiScan Cardiolite stress test was ordered for evaluation of chest discomfort.  Blood pressure was well controlled.  LexiScan Cardiolite was completed on 4 11 2016, and found to be high risk.  He revealed large, moderate intensity, nearly completely reversible anterior defect, medium sized, moderate intensity, partially reversible inferior lateral defect.  EF 83%.  No wall motion abnormalities.  She admits to feeling chest pressure, one evening, waking her up. She had mild shortness of breath.  She has obstructive sleep apnea, and has use of CPAP, which she is compliant with.  She has not had to take nitroglycerin.    Past Medical History  Diagnosis Date  . Atrial fibrillation   . Degenerative joint disease     Lumbosacral spine; status post right TKA  . Obesity   . Vertigo   . Sleep apnea     treated with CPAP  . Hypertension   . Fibromyalgia   . PUD (peptic ulcer disease)   . Hyperplastic colon polyp   . Diverticulosis   . Anxiety disorder     Past Surgical History  Procedure Laterality Date  . Appendectomy  1968  . Bilroth i procedure  1970s; 2002    s/p Bilroth 1 1970 for peptic ulcer disease;  revision in 2002  . Cholecystectomy  1972  . Laparoscopic tubal ligation    . Total  abdominal hysterectomy    . Cataract extraction    . Colonoscopy  08/2010  . Total knee arthroplasty  05/2011    Right      Current Outpatient Prescriptions  Medication Sig Dispense Refill  . ALPRAZolam (XANAX) 0.5 MG tablet Take 0.5 mg by mouth at bedtime as needed.      Marland Kitchen amLODipine (NORVASC) 5 MG tablet Take 1 tablet (5 mg total) by mouth daily. 90 tablet 3  . celecoxib (CELEBREX) 200 MG capsule Take 200 mg by mouth daily.      . fentaNYL (DURAGESIC - DOSED MCG/HR) 50 MCG/HR Place 50 mcg onto the skin every 3 (three) days.    Marland Kitchen FLUoxetine (PROZAC) 40 MG capsule Take 40 mg by mouth daily.      Marland Kitchen glycopyrrolate (ROBINUL) 2 MG tablet Take 2 mg by mouth as needed.     . hydrocodone-acetaminophen (LORCET-HD) 5-500 MG per capsule Take 1 capsule by mouth every 6 (six) hours as needed.      . hyoscyamine (NULEV) 0.125 MG TBDP Place 0.125 mg under the tongue every 4 (four) hours as needed.     . metoCLOPramide (REGLAN) 5 MG tablet Take 5 mg by mouth as needed.     . nitroGLYCERIN (NITROSTAT) 0.4 MG SL tablet Place 1 tablet (0.4 mg total) under the tongue every 5 (five) minutes as needed  for chest pain. 25 tablet 3  . omeprazole (PRILOSEC) 40 MG capsule Take 40 mg by mouth as needed.      No current facility-administered medications for this visit.    Allergies:   Codeine; Penicillins; and Sulfonamide derivatives    Social History:  The patient  reports that she has never smoked. She has never used smokeless tobacco. She reports that she does not drink alcohol or use illicit drugs.   Family History:  The patient's family history includes Emphysema in her father.    ROS: .   All other systems are reviewed and negative.Unless otherwise mentioned in H&P above.   PHYSICAL EXAM: VS:  BP 130/76 mmHg  Pulse 63  Ht 5\' 2"  (1.575 m)  Wt 250 lb (113.399 kg)  BMI 45.71 kg/m2 , BMI Body mass index is 45.71 kg/(m^2). GEN: Well nourished, well developed, in no acute distress HEENT: normal Neck:  no JVD, carotid bruits, or masses Cardiac: RRR; no murmurs, rubs, or gallops,no edema  Respiratory:  clear to auscultation bilaterally, normal work of breathing GI: soft, nontender, nondistended, + BS MS: no deformity or atrophy Skin: warm and dry, no rash Neuro:  Strength and sensation are intact. Chronic tremor Psych: euthymic mood, full affect   EKG: normal sinus rhythm, with nonspecific T-wave abnormality anteriorly, laterally, with some T wave flattening noted in III. Rate of 64 beats per minute.  Recent Labs: No results found for requested labs within last 365 days.    Lipid Panel    Component Value Date/Time   CHOL 203* 05/25/2012 1136   TRIG 139 05/25/2012 1136   HDL 40 05/25/2012 1136   CHOLHDL 5.1 05/25/2012 1136   VLDL 28 05/25/2012 1136   LDLCALC 135* 05/25/2012 1136      Wt Readings from Last 3 Encounters:  04/10/15 250 lb (113.399 kg)  04/03/15 252 lb (114.306 kg)  10/24/14 246 lb (111.585 kg)      Other studies Reviewed: Additional studies/ records that were reviewed today include: Lexiscan/ Cardiolite stress test Review of the above records demonstrates: Large moderate intensity, nearly completely reversible anterior defect, medium sized, moderate intensity partially reversible inferior lateral defect, EF of 83%, high-risk study without wall motion abnormalities   ASSESSMENT AND PLAN:  1.Abnormal LexiScan Cardiolite: The test is been reviewed by Dr. Carlyle Dolly, onsite.  He is in agreement that the patient needs to be planned for cardiac catheterization and possible PCI.I have explained the test results to the patient, using a heart model to describe her anatomy and filling defect.  I have spoken with her at length about cardiac catheterization to include risks and benefits.  She verbalizes understanding.  She is willing to proceed.  Cardiac catheterization will be completed on Friday, [place], 2016.  I have advised her, if chest pressure worsens, it  becomes unrelenting, she is to proceed to the nearest emergency room, where she will likely be transferred to come in and have cardiac catheterization sooner than scheduled date.  She is advised to have a bottle of nitroglycerin with her at all times and be sitting down when she uses the medication to prevent hypotension.  She verbalizes understanding.   2. DEAF Patient: The patient reads lips,and has partial hearing in her left ear.  3. Hypertension: she is to continue amlodipine, as recommended, as this is well controlled.  Blood pressure of 130/76 in the office.   4. Anxiety: She is advised to use Xanax when necessary as directed.  5. Chronic Tremor:  She has a chronic tremor related to nerve damage from degenerative disc disease.  Current medicines are reviewed at length with the patient today.    Labs/ tests ordered today include: pre-cardiac catheterization orders. No orders of the defined types were placed in this encounter.     Disposition:   FU post hospitalization after catheterization. Signed, Jory Sims, NP  04/10/2015 3:15 PM    [facility] [address], [place], [place] [address] Phone: [phone]; Fax: [phone]

## 2015-09-22 ENCOUNTER — Encounter: Payer: Self-pay | Admitting: Internal Medicine

## 2015-09-22 ENCOUNTER — Encounter: Payer: Self-pay | Admitting: Gastroenterology

## 2015-10-19 ENCOUNTER — Ambulatory Visit (AMBULATORY_SURGERY_CENTER): Payer: Self-pay | Admitting: *Deleted

## 2015-10-19 VITALS — Ht 64.0 in | Wt 248.0 lb

## 2015-10-19 DIAGNOSIS — Z86010 Personal history of colonic polyps: Secondary | ICD-10-CM

## 2015-10-19 NOTE — Progress Notes (Signed)
No egg or soy allergy No issues with past sedation No diet pills No home 02 use  emmi video to e mail [email or web address] Went over pre instructions with pt and her husband as pt is hearing impaired w/ bilateral hearing aides

## 2015-11-06 ENCOUNTER — Encounter: Payer: Self-pay | Admitting: Internal Medicine

## 2015-11-06 ENCOUNTER — Ambulatory Visit (AMBULATORY_SURGERY_CENTER): Payer: BLUE CROSS/BLUE SHIELD | Admitting: Internal Medicine

## 2015-11-06 VITALS — BP 126/91 | HR 56 | Temp 97.5°F | Resp 16 | Ht 64.0 in | Wt 248.0 lb

## 2015-11-06 DIAGNOSIS — Z86010 Personal history of colonic polyps: Secondary | ICD-10-CM

## 2015-11-06 MED ORDER — SODIUM CHLORIDE 0.9 % IV SOLN: 500.0000 mL | INTRAVENOUS | Status: DC

## 2015-11-06 NOTE — Op Note (Signed)
[facility]  [address] [place], [id number]   COLONOSCOPY PROCEDURE REPORT  PATIENT: Dana, [name]  MR#: [id number] BIRTHDATE: 27-Mar-1951 , 83  yrs. old GENDER: female ENDOSCOPIST: Gatha Mayer, MD, [facility] PROCEDURE DATE:  11/06/2015 PROCEDURE:   Colonoscopy, surveillance First Screening Colonoscopy - Avg.  risk and is 50 yrs.  old or older - No.  Prior Negative Screening - Now for repeat screening. N/A  History of Adenoma - Now for follow-up colonoscopy & has been > or = to 3 yrs.  N/A  Polyps removed today? No Recommend repeat exam, <10 yrs? No ASA CLASS:   Class III INDICATIONS:Surveillance due to prior colonic neoplasia and PH Colon Adenoma. MEDICATIONS: Propofol 250 mg IV and Monitored anesthesia care  DESCRIPTION OF PROCEDURE:   After the risks benefits and alternatives of the procedure were thoroughly explained, informed consent was obtained.  The digital rectal exam revealed no abnormalities of the rectum.   The LB [id number]  endoscope was introduced through the anus and advanced to the cecum, which was identified by both the appendix and ileocecal valve. No adverse events experienced.   The quality of the prep was good.  (MiraLax was used)  The instrument was then slowly withdrawn as the colon was fully examined. Estimated blood loss is zero unless otherwise noted in this procedure report.      COLON FINDINGS: There was severe diverticulosis noted in the left colon.   The examination was otherwise normal.  Retroflexed views revealed no abnormalities. The time to cecum = 2.6 Withdrawal time = 11.4   The scope was withdrawn and the procedure completed. COMPLICATIONS: There were no immediate complications.  ENDOSCOPIC IMPRESSION: 1.   Severe diverticulosis was noted in the left colon 2.   The examination was otherwise normal - no polyps - good prep - hx diminutive polyps destroyed 2006  RECOMMENDATIONS: Repeat colonoscopy 10  years.  eSigned:  Gatha Mayer, MD, [facility] 11/06/2015 4:41 PM   cc: The Patient and Dr. Sharilyn Sites

## 2015-11-06 NOTE — Patient Instructions (Signed)
YOU HAD AN ENDOSCOPIC PROCEDURE TODAY AT THE [facility]:   Refer to the procedure report that was given to you for any specific questions about what was found during the examination.  If the procedure report does not answer your questions, please call your gastroenterologist to clarify.  If you requested that your care partner not be given the details of your procedure findings, then the procedure report has been included in a sealed envelope for you to review at your convenience later.  YOU SHOULD EXPECT: Some feelings of bloating in the abdomen. Passage of more gas than usual.  Walking can help get rid of the air that was put into your GI tract during the procedure and reduce the bloating. If you had a lower endoscopy (such as a colonoscopy or flexible sigmoidoscopy) you may notice spotting of blood in your stool or on the toilet paper. If you underwent a bowel prep for your procedure, you may not have a normal bowel movement for a few days.  Please Note:  You might notice some irritation and congestion in your nose or some drainage.  This is from the oxygen used during your procedure.  There is no need for concern and it should clear up in a day or so.  SYMPTOMS TO REPORT IMMEDIATELY:   Following lower endoscopy (colonoscopy or flexible sigmoidoscopy):  Excessive amounts of blood in the stool  Significant tenderness or worsening of abdominal pains  Swelling of the abdomen that is new, acute  Fever of 100F or higher   For urgent or emergent issues, a gastroenterologist can be reached at any hour by calling [phone].   DIET: Your first meal following the procedure should be a small meal and then it is ok to progress to your normal diet. Heavy or fried foods are harder to digest and may make you feel nauseous or bloated.  Likewise, meals heavy in dairy and vegetables can increase bloating.  Drink plenty of fluids but you should avoid alcoholic beverages for 24  hours.  ACTIVITY:  You should plan to take it easy for the rest of today and you should NOT DRIVE or use heavy machinery until tomorrow (because of the sedation medicines used during the test).    FOLLOW UP: Our staff will call the number listed on your records the next business day following your procedure to check on you and address any questions or concerns that you may have regarding the information given to you following your procedure. If we do not reach you, we will leave a message.  However, if you are feeling well and you are not experiencing any problems, there is no need to return our call.  We will assume that you have returned to your regular daily activities without incident.  If any biopsies were taken you will be contacted by phone or by letter within the next 1-3 weeks.  Please call us at [phone] if you have not heard about the biopsies in 3 weeks.    SIGNATURES/CONFIDENTIALITY: You and/or your care partner have signed paperwork which will be entered into your electronic medical record.  These signatures attest to the fact that that the information above on your After Visit Summary has been reviewed and is understood.  Full responsibility of the confidentiality of this discharge information lies with you and/or your care-partner.    Resume medications. Information given on polyps. 

## 2015-11-06 NOTE — Progress Notes (Signed)
To recovery, report to Brown, RN, VSS. 

## 2015-11-07 ENCOUNTER — Telehealth: Payer: Self-pay | Admitting: Internal Medicine

## 2015-11-07 NOTE — Telephone Encounter (Signed)
°  Follow up Call-  Call back number 11/06/2015  Post procedure Call Back phone  # [phone] husband number/ Pt HOH   home # [phone] leave message   Permission to leave phone message Yes     Patient questions:  Do you have a fever, pain , or abdominal swelling? No. Pain Score  0 *  Have you tolerated food without any problems? Yes.    Have you been able to return to your normal activities? Yes.    Do you have any questions about your discharge instructions: Diet   Yes.   Medications  No. Follow up visit  No.  Do you have questions or concerns about your Care? No.  Actions: * If pain score is 4 or above: No action needed, pain <4.

## 2015-11-20 ENCOUNTER — Other Ambulatory Visit: Payer: Self-pay | Admitting: *Deleted

## 2015-11-20 MED ORDER — AMLODIPINE BESYLATE 5 MG PO TABS: 5.0000 mg | ORAL_TABLET | Freq: Every day | ORAL | Status: DC

## 2015-12-08 ENCOUNTER — Other Ambulatory Visit: Payer: Self-pay | Admitting: Family Medicine

## 2015-12-08 DIAGNOSIS — Z1231 Encounter for screening mammogram for malignant neoplasm of breast: Secondary | ICD-10-CM

## 2015-12-12 ENCOUNTER — Other Ambulatory Visit (HOSPITAL_COMMUNITY): Payer: Self-pay | Admitting: Family Medicine

## 2015-12-12 DIAGNOSIS — Z1231 Encounter for screening mammogram for malignant neoplasm of breast: Secondary | ICD-10-CM

## 2015-12-28 ENCOUNTER — Ambulatory Visit (HOSPITAL_COMMUNITY)
Admission: RE | Admit: 2015-12-28 | Discharge: 2015-12-28 | Disposition: A | Discharge status: Home / Self Care | Payer: BLUE CROSS/BLUE SHIELD | Source: Ambulatory Visit | Attending: Family Medicine | Admitting: Family Medicine

## 2015-12-28 ENCOUNTER — Other Ambulatory Visit (HOSPITAL_COMMUNITY): Payer: Self-pay | Admitting: Family Medicine

## 2015-12-28 DIAGNOSIS — Z1231 Encounter for screening mammogram for malignant neoplasm of breast: Secondary | ICD-10-CM | POA: Insufficient documentation

## 2016-02-19 DIAGNOSIS — H6122 Impacted cerumen, left ear: Secondary | ICD-10-CM | POA: Diagnosis not present

## 2016-02-19 DIAGNOSIS — H903 Sensorineural hearing loss, bilateral: Secondary | ICD-10-CM | POA: Diagnosis not present

## 2016-02-20 ENCOUNTER — Other Ambulatory Visit (INDEPENDENT_AMBULATORY_CARE_PROVIDER_SITE_OTHER): Payer: Self-pay | Admitting: Otolaryngology

## 2016-02-20 DIAGNOSIS — H9192 Unspecified hearing loss, left ear: Secondary | ICD-10-CM

## 2016-02-23 ENCOUNTER — Ambulatory Visit (HOSPITAL_COMMUNITY)
Admission: RE | Admit: 2016-02-23 | Discharge: 2016-02-23 | Disposition: A | Discharge status: Home / Self Care | Payer: BLUE CROSS/BLUE SHIELD | Source: Ambulatory Visit | Attending: Otolaryngology | Admitting: Otolaryngology

## 2016-02-23 DIAGNOSIS — H900 Conductive hearing loss, bilateral: Secondary | ICD-10-CM | POA: Diagnosis not present

## 2016-02-23 DIAGNOSIS — H9191 Unspecified hearing loss, right ear: Secondary | ICD-10-CM | POA: Diagnosis not present

## 2016-02-23 DIAGNOSIS — H9192 Unspecified hearing loss, left ear: Secondary | ICD-10-CM

## 2016-03-18 ENCOUNTER — Other Ambulatory Visit: Payer: Self-pay

## 2016-03-18 MED ORDER — AMLODIPINE BESYLATE 5 MG PO TABS: 5.0000 mg | ORAL_TABLET | Freq: Every day | ORAL | Status: AC

## 2016-03-20 DIAGNOSIS — H903 Sensorineural hearing loss, bilateral: Secondary | ICD-10-CM | POA: Diagnosis not present

## 2016-03-20 DIAGNOSIS — H6122 Impacted cerumen, left ear: Secondary | ICD-10-CM | POA: Diagnosis not present

## 2016-03-21 DIAGNOSIS — Z1389 Encounter for screening for other disorder: Secondary | ICD-10-CM | POA: Diagnosis not present

## 2016-03-21 DIAGNOSIS — F419 Anxiety disorder, unspecified: Secondary | ICD-10-CM | POA: Diagnosis not present

## 2016-03-21 DIAGNOSIS — G894 Chronic pain syndrome: Secondary | ICD-10-CM | POA: Diagnosis not present

## 2016-03-21 DIAGNOSIS — Z6841 Body Mass Index (BMI) 40.0 and over, adult: Secondary | ICD-10-CM | POA: Diagnosis not present

## 2016-06-18 DIAGNOSIS — Z6839 Body mass index (BMI) 39.0-39.9, adult: Secondary | ICD-10-CM | POA: Diagnosis not present

## 2016-06-18 DIAGNOSIS — Z1389 Encounter for screening for other disorder: Secondary | ICD-10-CM | POA: Diagnosis not present

## 2016-06-18 DIAGNOSIS — G894 Chronic pain syndrome: Secondary | ICD-10-CM | POA: Diagnosis not present

## 2016-06-19 DIAGNOSIS — H60399 Other infective otitis externa, unspecified ear: Secondary | ICD-10-CM | POA: Diagnosis not present

## 2016-06-19 DIAGNOSIS — H903 Sensorineural hearing loss, bilateral: Secondary | ICD-10-CM | POA: Diagnosis not present

## 2016-08-01 DIAGNOSIS — G4730 Sleep apnea, unspecified: Secondary | ICD-10-CM | POA: Diagnosis not present

## 2016-08-01 DIAGNOSIS — G4733 Obstructive sleep apnea (adult) (pediatric): Secondary | ICD-10-CM | POA: Diagnosis not present

## 2016-09-18 DIAGNOSIS — H6092 Unspecified otitis externa, left ear: Secondary | ICD-10-CM | POA: Diagnosis not present

## 2016-09-18 DIAGNOSIS — Z1389 Encounter for screening for other disorder: Secondary | ICD-10-CM | POA: Diagnosis not present

## 2016-09-18 DIAGNOSIS — L448 Other specified papulosquamous disorders: Secondary | ICD-10-CM | POA: Diagnosis not present

## 2016-09-18 DIAGNOSIS — M25552 Pain in left hip: Secondary | ICD-10-CM | POA: Diagnosis not present

## 2016-09-18 DIAGNOSIS — Z6839 Body mass index (BMI) 39.0-39.9, adult: Secondary | ICD-10-CM | POA: Diagnosis not present

## 2016-12-24 DIAGNOSIS — I10 Essential (primary) hypertension: Secondary | ICD-10-CM | POA: Diagnosis not present

## 2016-12-24 DIAGNOSIS — Z6841 Body Mass Index (BMI) 40.0 and over, adult: Secondary | ICD-10-CM | POA: Diagnosis not present

## 2016-12-24 DIAGNOSIS — G894 Chronic pain syndrome: Secondary | ICD-10-CM | POA: Diagnosis not present

## 2016-12-24 DIAGNOSIS — T461X5A Adverse effect of calcium-channel blockers, initial encounter: Secondary | ICD-10-CM | POA: Diagnosis not present

## 2017-01-28 ENCOUNTER — Other Ambulatory Visit (HOSPITAL_COMMUNITY): Payer: Self-pay | Admitting: Family Medicine

## 2017-01-28 ENCOUNTER — Ambulatory Visit (HOSPITAL_COMMUNITY)
Admission: RE | Admit: 2017-01-28 | Discharge: 2017-01-28 | Disposition: A | Discharge status: Home / Self Care | Payer: Medicare Other | Source: Ambulatory Visit | Attending: Family Medicine | Admitting: Family Medicine

## 2017-01-28 DIAGNOSIS — E782 Mixed hyperlipidemia: Secondary | ICD-10-CM | POA: Diagnosis not present

## 2017-01-28 DIAGNOSIS — M25552 Pain in left hip: Secondary | ICD-10-CM | POA: Diagnosis not present

## 2017-01-28 DIAGNOSIS — M160 Bilateral primary osteoarthritis of hip: Secondary | ICD-10-CM | POA: Diagnosis not present

## 2017-01-28 DIAGNOSIS — Z6841 Body Mass Index (BMI) 40.0 and over, adult: Secondary | ICD-10-CM | POA: Diagnosis not present

## 2017-01-28 DIAGNOSIS — R52 Pain, unspecified: Secondary | ICD-10-CM

## 2017-01-28 DIAGNOSIS — M1612 Unilateral primary osteoarthritis, left hip: Secondary | ICD-10-CM | POA: Diagnosis not present

## 2017-01-28 DIAGNOSIS — I10 Essential (primary) hypertension: Secondary | ICD-10-CM | POA: Diagnosis not present

## 2017-02-24 DIAGNOSIS — G894 Chronic pain syndrome: Secondary | ICD-10-CM | POA: Diagnosis not present

## 2017-02-24 DIAGNOSIS — I10 Essential (primary) hypertension: Secondary | ICD-10-CM | POA: Diagnosis not present

## 2017-02-24 DIAGNOSIS — Z6841 Body Mass Index (BMI) 40.0 and over, adult: Secondary | ICD-10-CM | POA: Diagnosis not present

## 2017-04-03 DIAGNOSIS — M7062 Trochanteric bursitis, left hip: Secondary | ICD-10-CM | POA: Diagnosis not present

## 2017-04-07 DIAGNOSIS — F419 Anxiety disorder, unspecified: Secondary | ICD-10-CM | POA: Diagnosis not present

## 2017-04-07 DIAGNOSIS — G894 Chronic pain syndrome: Secondary | ICD-10-CM | POA: Diagnosis not present

## 2017-04-07 DIAGNOSIS — Z6841 Body Mass Index (BMI) 40.0 and over, adult: Secondary | ICD-10-CM | POA: Diagnosis not present

## 2017-05-21 DIAGNOSIS — R599 Enlarged lymph nodes, unspecified: Secondary | ICD-10-CM | POA: Diagnosis not present

## 2017-05-21 DIAGNOSIS — Z6841 Body Mass Index (BMI) 40.0 and over, adult: Secondary | ICD-10-CM | POA: Diagnosis not present

## 2017-05-21 DIAGNOSIS — G894 Chronic pain syndrome: Secondary | ICD-10-CM | POA: Diagnosis not present

## 2017-05-21 DIAGNOSIS — H60502 Unspecified acute noninfective otitis externa, left ear: Secondary | ICD-10-CM | POA: Diagnosis not present

## 2017-05-27 ENCOUNTER — Other Ambulatory Visit (HOSPITAL_COMMUNITY): Payer: Self-pay | Admitting: Family Medicine

## 2017-05-27 DIAGNOSIS — E2839 Other primary ovarian failure: Secondary | ICD-10-CM

## 2017-07-01 DIAGNOSIS — G894 Chronic pain syndrome: Secondary | ICD-10-CM | POA: Diagnosis not present

## 2017-07-01 DIAGNOSIS — Z6841 Body Mass Index (BMI) 40.0 and over, adult: Secondary | ICD-10-CM | POA: Diagnosis not present

## 2017-07-01 DIAGNOSIS — R221 Localized swelling, mass and lump, neck: Secondary | ICD-10-CM | POA: Diagnosis not present

## 2017-07-02 ENCOUNTER — Other Ambulatory Visit (HOSPITAL_COMMUNITY): Payer: Self-pay | Admitting: Family Medicine

## 2017-07-02 DIAGNOSIS — R221 Localized swelling, mass and lump, neck: Secondary | ICD-10-CM

## 2017-07-02 DIAGNOSIS — Z6841 Body Mass Index (BMI) 40.0 and over, adult: Secondary | ICD-10-CM

## 2017-07-04 ENCOUNTER — Ambulatory Visit (HOSPITAL_COMMUNITY)
Admission: RE | Admit: 2017-07-04 | Discharge: 2017-07-04 | Disposition: A | Discharge status: Home / Self Care | Payer: Medicare Other | Source: Ambulatory Visit | Attending: Family Medicine | Admitting: Family Medicine

## 2017-07-04 DIAGNOSIS — M542 Cervicalgia: Secondary | ICD-10-CM | POA: Diagnosis not present

## 2017-07-04 DIAGNOSIS — R221 Localized swelling, mass and lump, neck: Secondary | ICD-10-CM

## 2017-07-04 DIAGNOSIS — Z6841 Body Mass Index (BMI) 40.0 and over, adult: Secondary | ICD-10-CM

## 2017-07-17 ENCOUNTER — Ambulatory Visit (INDEPENDENT_AMBULATORY_CARE_PROVIDER_SITE_OTHER): Payer: Medicare Other | Admitting: Otolaryngology

## 2017-07-17 DIAGNOSIS — R590 Localized enlarged lymph nodes: Secondary | ICD-10-CM

## 2017-07-28 ENCOUNTER — Other Ambulatory Visit (HOSPITAL_COMMUNITY): Payer: Medicare Other

## 2017-08-01 ENCOUNTER — Other Ambulatory Visit (HOSPITAL_COMMUNITY): Payer: Medicare Other

## 2017-08-13 ENCOUNTER — Ambulatory Visit (HOSPITAL_COMMUNITY)
Admission: RE | Admit: 2017-08-13 | Discharge: 2017-08-13 | Disposition: A | Discharge status: Home / Self Care | Payer: Medicare Other | Source: Ambulatory Visit | Attending: Family Medicine | Admitting: Family Medicine

## 2017-08-13 DIAGNOSIS — M8588 Other specified disorders of bone density and structure, other site: Secondary | ICD-10-CM | POA: Diagnosis not present

## 2017-08-13 DIAGNOSIS — Z780 Asymptomatic menopausal state: Secondary | ICD-10-CM | POA: Diagnosis not present

## 2017-08-13 DIAGNOSIS — E2839 Other primary ovarian failure: Secondary | ICD-10-CM | POA: Diagnosis present

## 2017-08-13 DIAGNOSIS — M8589 Other specified disorders of bone density and structure, multiple sites: Secondary | ICD-10-CM | POA: Diagnosis not present

## 2017-08-15 DIAGNOSIS — G894 Chronic pain syndrome: Secondary | ICD-10-CM | POA: Diagnosis not present

## 2017-08-15 DIAGNOSIS — Z6841 Body Mass Index (BMI) 40.0 and over, adult: Secondary | ICD-10-CM | POA: Diagnosis not present

## 2017-08-15 DIAGNOSIS — Z1389 Encounter for screening for other disorder: Secondary | ICD-10-CM | POA: Diagnosis not present

## 2017-09-18 ENCOUNTER — Ambulatory Visit (INDEPENDENT_AMBULATORY_CARE_PROVIDER_SITE_OTHER): Payer: Medicare Other | Admitting: Otolaryngology

## 2017-09-18 DIAGNOSIS — D487 Neoplasm of uncertain behavior of other specified sites: Secondary | ICD-10-CM | POA: Diagnosis not present

## 2017-09-18 DIAGNOSIS — H903 Sensorineural hearing loss, bilateral: Secondary | ICD-10-CM | POA: Diagnosis not present

## 2017-09-25 DIAGNOSIS — G894 Chronic pain syndrome: Secondary | ICD-10-CM | POA: Diagnosis not present

## 2017-09-25 DIAGNOSIS — F419 Anxiety disorder, unspecified: Secondary | ICD-10-CM | POA: Diagnosis not present

## 2017-09-25 DIAGNOSIS — L308 Other specified dermatitis: Secondary | ICD-10-CM | POA: Diagnosis not present

## 2017-09-25 DIAGNOSIS — Z6841 Body Mass Index (BMI) 40.0 and over, adult: Secondary | ICD-10-CM | POA: Diagnosis not present

## 2017-10-01 DIAGNOSIS — H35033 Hypertensive retinopathy, bilateral: Secondary | ICD-10-CM | POA: Diagnosis not present

## 2017-10-29 DIAGNOSIS — Z6841 Body Mass Index (BMI) 40.0 and over, adult: Secondary | ICD-10-CM | POA: Diagnosis not present

## 2017-10-29 DIAGNOSIS — E782 Mixed hyperlipidemia: Secondary | ICD-10-CM | POA: Diagnosis not present

## 2017-10-29 DIAGNOSIS — Z0001 Encounter for general adult medical examination with abnormal findings: Secondary | ICD-10-CM | POA: Diagnosis not present

## 2017-10-29 DIAGNOSIS — G4733 Obstructive sleep apnea (adult) (pediatric): Secondary | ICD-10-CM | POA: Diagnosis not present

## 2017-10-29 DIAGNOSIS — Z1389 Encounter for screening for other disorder: Secondary | ICD-10-CM | POA: Diagnosis not present

## 2017-11-10 DIAGNOSIS — G4730 Sleep apnea, unspecified: Secondary | ICD-10-CM | POA: Diagnosis not present

## 2017-11-10 DIAGNOSIS — G4733 Obstructive sleep apnea (adult) (pediatric): Secondary | ICD-10-CM | POA: Diagnosis not present

## 2017-11-17 DIAGNOSIS — Z01818 Encounter for other preprocedural examination: Secondary | ICD-10-CM | POA: Diagnosis not present

## 2017-11-17 DIAGNOSIS — H903 Sensorineural hearing loss, bilateral: Secondary | ICD-10-CM | POA: Diagnosis not present

## 2017-11-17 DIAGNOSIS — H905 Unspecified sensorineural hearing loss: Secondary | ICD-10-CM | POA: Diagnosis not present

## 2017-11-25 DIAGNOSIS — Z23 Encounter for immunization: Secondary | ICD-10-CM | POA: Diagnosis not present

## 2017-11-25 DIAGNOSIS — Z6841 Body Mass Index (BMI) 40.0 and over, adult: Secondary | ICD-10-CM | POA: Diagnosis not present

## 2017-11-25 DIAGNOSIS — G894 Chronic pain syndrome: Secondary | ICD-10-CM | POA: Diagnosis not present

## 2017-12-04 ENCOUNTER — Other Ambulatory Visit (HOSPITAL_COMMUNITY): Payer: Self-pay | Admitting: Family Medicine

## 2017-12-04 DIAGNOSIS — Z1231 Encounter for screening mammogram for malignant neoplasm of breast: Secondary | ICD-10-CM

## 2017-12-24 DIAGNOSIS — H903 Sensorineural hearing loss, bilateral: Secondary | ICD-10-CM | POA: Diagnosis not present

## 2017-12-24 DIAGNOSIS — Z01818 Encounter for other preprocedural examination: Secondary | ICD-10-CM | POA: Diagnosis not present

## 2017-12-29 ENCOUNTER — Ambulatory Visit (HOSPITAL_COMMUNITY)
Admission: RE | Admit: 2017-12-29 | Discharge: 2017-12-29 | Disposition: A | Discharge status: Home / Self Care | Payer: Medicare Other | Source: Ambulatory Visit | Attending: Family Medicine | Admitting: Family Medicine

## 2017-12-29 DIAGNOSIS — Z1231 Encounter for screening mammogram for malignant neoplasm of breast: Secondary | ICD-10-CM | POA: Diagnosis not present

## 2017-12-30 ENCOUNTER — Other Ambulatory Visit (HOSPITAL_COMMUNITY): Payer: Self-pay | Admitting: Family Medicine

## 2017-12-30 DIAGNOSIS — R928 Other abnormal and inconclusive findings on diagnostic imaging of breast: Secondary | ICD-10-CM

## 2017-12-31 DIAGNOSIS — Z6841 Body Mass Index (BMI) 40.0 and over, adult: Secondary | ICD-10-CM | POA: Diagnosis not present

## 2017-12-31 DIAGNOSIS — G894 Chronic pain syndrome: Secondary | ICD-10-CM | POA: Diagnosis not present

## 2018-01-06 DIAGNOSIS — E669 Obesity, unspecified: Secondary | ICD-10-CM | POA: Diagnosis not present

## 2018-01-06 DIAGNOSIS — M609 Myositis, unspecified: Secondary | ICD-10-CM | POA: Diagnosis not present

## 2018-01-06 DIAGNOSIS — H903 Sensorineural hearing loss, bilateral: Secondary | ICD-10-CM | POA: Diagnosis not present

## 2018-01-06 DIAGNOSIS — E785 Hyperlipidemia, unspecified: Secondary | ICD-10-CM | POA: Diagnosis not present

## 2018-01-06 DIAGNOSIS — H7010 Chronic mastoiditis, unspecified ear: Secondary | ICD-10-CM | POA: Diagnosis not present

## 2018-01-06 DIAGNOSIS — G4733 Obstructive sleep apnea (adult) (pediatric): Secondary | ICD-10-CM | POA: Diagnosis not present

## 2018-01-06 DIAGNOSIS — Z45321 Encounter for adjustment and management of cochlear device: Secondary | ICD-10-CM | POA: Diagnosis not present

## 2018-01-06 DIAGNOSIS — M1990 Unspecified osteoarthritis, unspecified site: Secondary | ICD-10-CM | POA: Diagnosis not present

## 2018-01-06 DIAGNOSIS — Z86010 Personal history of colonic polyps: Secondary | ICD-10-CM | POA: Diagnosis not present

## 2018-01-06 DIAGNOSIS — I10 Essential (primary) hypertension: Secondary | ICD-10-CM | POA: Diagnosis not present

## 2018-01-06 DIAGNOSIS — I4891 Unspecified atrial fibrillation: Secondary | ICD-10-CM | POA: Diagnosis not present

## 2018-01-06 DIAGNOSIS — R0600 Dyspnea, unspecified: Secondary | ICD-10-CM | POA: Diagnosis not present

## 2018-01-07 DIAGNOSIS — R0600 Dyspnea, unspecified: Secondary | ICD-10-CM | POA: Diagnosis not present

## 2018-01-07 DIAGNOSIS — E785 Hyperlipidemia, unspecified: Secondary | ICD-10-CM | POA: Diagnosis not present

## 2018-01-07 DIAGNOSIS — M1990 Unspecified osteoarthritis, unspecified site: Secondary | ICD-10-CM | POA: Diagnosis not present

## 2018-01-07 DIAGNOSIS — M609 Myositis, unspecified: Secondary | ICD-10-CM | POA: Diagnosis not present

## 2018-01-07 DIAGNOSIS — E669 Obesity, unspecified: Secondary | ICD-10-CM | POA: Diagnosis not present

## 2018-01-07 DIAGNOSIS — H903 Sensorineural hearing loss, bilateral: Secondary | ICD-10-CM | POA: Diagnosis not present

## 2018-01-07 DIAGNOSIS — I10 Essential (primary) hypertension: Secondary | ICD-10-CM | POA: Diagnosis not present

## 2018-01-07 DIAGNOSIS — G4733 Obstructive sleep apnea (adult) (pediatric): Secondary | ICD-10-CM | POA: Diagnosis not present

## 2018-01-07 DIAGNOSIS — Z86010 Personal history of colonic polyps: Secondary | ICD-10-CM | POA: Diagnosis not present

## 2018-01-07 DIAGNOSIS — I4891 Unspecified atrial fibrillation: Secondary | ICD-10-CM | POA: Diagnosis not present

## 2018-01-13 ENCOUNTER — Encounter (HOSPITAL_COMMUNITY): Payer: Self-pay

## 2018-01-13 ENCOUNTER — Encounter (HOSPITAL_COMMUNITY): Payer: Medicare Other

## 2018-02-10 ENCOUNTER — Ambulatory Visit (HOSPITAL_COMMUNITY)
Admission: RE | Admit: 2018-02-10 | Discharge: 2018-02-10 | Disposition: A | Discharge status: Home / Self Care | Payer: Medicare Other | Source: Ambulatory Visit | Attending: Family Medicine | Admitting: Family Medicine

## 2018-02-10 DIAGNOSIS — R928 Other abnormal and inconclusive findings on diagnostic imaging of breast: Secondary | ICD-10-CM | POA: Diagnosis not present

## 2018-02-10 DIAGNOSIS — N6489 Other specified disorders of breast: Secondary | ICD-10-CM | POA: Insufficient documentation

## 2018-02-10 DIAGNOSIS — N6310 Unspecified lump in the right breast, unspecified quadrant: Secondary | ICD-10-CM | POA: Diagnosis not present

## 2018-02-11 DIAGNOSIS — G894 Chronic pain syndrome: Secondary | ICD-10-CM | POA: Diagnosis not present

## 2018-02-11 DIAGNOSIS — Z6841 Body Mass Index (BMI) 40.0 and over, adult: Secondary | ICD-10-CM | POA: Diagnosis not present

## 2018-02-11 DIAGNOSIS — Z9621 Cochlear implant status: Secondary | ICD-10-CM | POA: Diagnosis not present

## 2018-03-25 DIAGNOSIS — H43391 Other vitreous opacities, right eye: Secondary | ICD-10-CM | POA: Diagnosis not present

## 2018-03-26 DIAGNOSIS — G894 Chronic pain syndrome: Secondary | ICD-10-CM | POA: Diagnosis not present

## 2018-03-26 DIAGNOSIS — J069 Acute upper respiratory infection, unspecified: Secondary | ICD-10-CM | POA: Diagnosis not present

## 2018-03-26 DIAGNOSIS — Z6841 Body Mass Index (BMI) 40.0 and over, adult: Secondary | ICD-10-CM | POA: Diagnosis not present

## 2018-04-24 DIAGNOSIS — G894 Chronic pain syndrome: Secondary | ICD-10-CM | POA: Diagnosis not present

## 2018-04-24 DIAGNOSIS — Z6841 Body Mass Index (BMI) 40.0 and over, adult: Secondary | ICD-10-CM | POA: Diagnosis not present

## 2018-05-28 DIAGNOSIS — F321 Major depressive disorder, single episode, moderate: Secondary | ICD-10-CM | POA: Diagnosis not present

## 2018-05-28 DIAGNOSIS — G894 Chronic pain syndrome: Secondary | ICD-10-CM | POA: Diagnosis not present

## 2018-05-28 DIAGNOSIS — Z6841 Body Mass Index (BMI) 40.0 and over, adult: Secondary | ICD-10-CM | POA: Diagnosis not present

## 2018-07-13 DIAGNOSIS — G894 Chronic pain syndrome: Secondary | ICD-10-CM | POA: Diagnosis not present

## 2018-07-13 DIAGNOSIS — F419 Anxiety disorder, unspecified: Secondary | ICD-10-CM | POA: Diagnosis not present

## 2018-07-13 DIAGNOSIS — Z6841 Body Mass Index (BMI) 40.0 and over, adult: Secondary | ICD-10-CM | POA: Diagnosis not present

## 2018-08-11 ENCOUNTER — Other Ambulatory Visit (HOSPITAL_COMMUNITY): Payer: Self-pay | Admitting: Family Medicine

## 2018-08-11 DIAGNOSIS — Z6841 Body Mass Index (BMI) 40.0 and over, adult: Secondary | ICD-10-CM | POA: Diagnosis not present

## 2018-08-11 DIAGNOSIS — R928 Other abnormal and inconclusive findings on diagnostic imaging of breast: Secondary | ICD-10-CM

## 2018-08-11 DIAGNOSIS — R110 Nausea: Secondary | ICD-10-CM | POA: Diagnosis not present

## 2018-08-11 DIAGNOSIS — G894 Chronic pain syndrome: Secondary | ICD-10-CM | POA: Diagnosis not present

## 2018-09-01 ENCOUNTER — Ambulatory Visit (HOSPITAL_COMMUNITY)
Admission: RE | Admit: 2018-09-01 | Discharge: 2018-09-01 | Disposition: A | Discharge status: Home / Self Care | Payer: Medicare Other | Source: Ambulatory Visit | Attending: Family Medicine | Admitting: Family Medicine

## 2018-09-01 DIAGNOSIS — R921 Mammographic calcification found on diagnostic imaging of breast: Secondary | ICD-10-CM | POA: Diagnosis not present

## 2018-09-01 DIAGNOSIS — R928 Other abnormal and inconclusive findings on diagnostic imaging of breast: Secondary | ICD-10-CM

## 2018-09-01 DIAGNOSIS — N6489 Other specified disorders of breast: Secondary | ICD-10-CM | POA: Diagnosis not present

## 2018-09-14 DIAGNOSIS — Z6841 Body Mass Index (BMI) 40.0 and over, adult: Secondary | ICD-10-CM | POA: Diagnosis not present

## 2018-09-14 DIAGNOSIS — G894 Chronic pain syndrome: Secondary | ICD-10-CM | POA: Diagnosis not present

## 2018-09-14 DIAGNOSIS — Z23 Encounter for immunization: Secondary | ICD-10-CM | POA: Diagnosis not present

## 2018-10-20 DIAGNOSIS — Z681 Body mass index (BMI) 19 or less, adult: Secondary | ICD-10-CM | POA: Diagnosis not present

## 2018-10-20 DIAGNOSIS — Z1389 Encounter for screening for other disorder: Secondary | ICD-10-CM | POA: Diagnosis not present

## 2018-10-20 DIAGNOSIS — G894 Chronic pain syndrome: Secondary | ICD-10-CM | POA: Diagnosis not present

## 2018-11-18 DIAGNOSIS — Z1389 Encounter for screening for other disorder: Secondary | ICD-10-CM | POA: Diagnosis not present

## 2018-11-18 DIAGNOSIS — Z6841 Body Mass Index (BMI) 40.0 and over, adult: Secondary | ICD-10-CM | POA: Diagnosis not present

## 2018-11-18 DIAGNOSIS — G894 Chronic pain syndrome: Secondary | ICD-10-CM | POA: Diagnosis not present

## 2018-11-18 DIAGNOSIS — Z0001 Encounter for general adult medical examination with abnormal findings: Secondary | ICD-10-CM | POA: Diagnosis not present

## 2018-12-30 DIAGNOSIS — G894 Chronic pain syndrome: Secondary | ICD-10-CM | POA: Diagnosis not present

## 2018-12-30 DIAGNOSIS — Z6841 Body Mass Index (BMI) 40.0 and over, adult: Secondary | ICD-10-CM | POA: Diagnosis not present

## 2019-02-04 DIAGNOSIS — G894 Chronic pain syndrome: Secondary | ICD-10-CM | POA: Diagnosis not present

## 2019-02-04 DIAGNOSIS — Z6841 Body Mass Index (BMI) 40.0 and over, adult: Secondary | ICD-10-CM | POA: Diagnosis not present

## 2019-02-08 ENCOUNTER — Other Ambulatory Visit (HOSPITAL_COMMUNITY): Payer: Self-pay | Admitting: Family Medicine

## 2019-02-08 DIAGNOSIS — N6310 Unspecified lump in the right breast, unspecified quadrant: Secondary | ICD-10-CM

## 2019-02-23 ENCOUNTER — Encounter (HOSPITAL_COMMUNITY): Payer: Medicare Other

## 2019-02-23 ENCOUNTER — Other Ambulatory Visit (HOSPITAL_COMMUNITY): Payer: Medicare Other

## 2019-03-01 DIAGNOSIS — H903 Sensorineural hearing loss, bilateral: Secondary | ICD-10-CM | POA: Diagnosis not present

## 2019-03-16 DIAGNOSIS — G894 Chronic pain syndrome: Secondary | ICD-10-CM | POA: Diagnosis not present

## 2019-04-16 DIAGNOSIS — G894 Chronic pain syndrome: Secondary | ICD-10-CM | POA: Diagnosis not present

## 2019-04-16 DIAGNOSIS — Z6841 Body Mass Index (BMI) 40.0 and over, adult: Secondary | ICD-10-CM | POA: Diagnosis not present

## 2019-04-16 DIAGNOSIS — K219 Gastro-esophageal reflux disease without esophagitis: Secondary | ICD-10-CM | POA: Diagnosis not present

## 2019-05-19 DIAGNOSIS — G894 Chronic pain syndrome: Secondary | ICD-10-CM | POA: Diagnosis not present

## 2019-05-19 DIAGNOSIS — Z6841 Body Mass Index (BMI) 40.0 and over, adult: Secondary | ICD-10-CM | POA: Diagnosis not present

## 2019-05-19 DIAGNOSIS — Z1389 Encounter for screening for other disorder: Secondary | ICD-10-CM | POA: Diagnosis not present

## 2019-06-14 DIAGNOSIS — Z6841 Body Mass Index (BMI) 40.0 and over, adult: Secondary | ICD-10-CM | POA: Diagnosis not present

## 2019-06-14 DIAGNOSIS — G894 Chronic pain syndrome: Secondary | ICD-10-CM | POA: Diagnosis not present

## 2019-06-17 DIAGNOSIS — Z96651 Presence of right artificial knee joint: Secondary | ICD-10-CM | POA: Diagnosis not present

## 2019-06-17 DIAGNOSIS — M1611 Unilateral primary osteoarthritis, right hip: Secondary | ICD-10-CM | POA: Diagnosis not present

## 2019-06-29 DIAGNOSIS — H353110 Nonexudative age-related macular degeneration, right eye, stage unspecified: Secondary | ICD-10-CM | POA: Diagnosis not present

## 2019-07-14 DIAGNOSIS — M25551 Pain in right hip: Secondary | ICD-10-CM | POA: Diagnosis not present

## 2019-07-27 DIAGNOSIS — G894 Chronic pain syndrome: Secondary | ICD-10-CM | POA: Diagnosis not present

## 2019-09-02 DIAGNOSIS — G894 Chronic pain syndrome: Secondary | ICD-10-CM | POA: Diagnosis not present

## 2019-09-02 DIAGNOSIS — F329 Major depressive disorder, single episode, unspecified: Secondary | ICD-10-CM | POA: Diagnosis not present

## 2019-09-02 DIAGNOSIS — Z6841 Body Mass Index (BMI) 40.0 and over, adult: Secondary | ICD-10-CM | POA: Diagnosis not present

## 2019-09-30 DIAGNOSIS — G894 Chronic pain syndrome: Secondary | ICD-10-CM | POA: Diagnosis not present

## 2019-09-30 DIAGNOSIS — Z6841 Body Mass Index (BMI) 40.0 and over, adult: Secondary | ICD-10-CM | POA: Diagnosis not present

## 2019-11-03 DIAGNOSIS — G894 Chronic pain syndrome: Secondary | ICD-10-CM | POA: Diagnosis not present

## 2019-11-03 DIAGNOSIS — Z6841 Body Mass Index (BMI) 40.0 and over, adult: Secondary | ICD-10-CM | POA: Diagnosis not present

## 2019-11-18 IMAGING — MG 2D DIGITAL SCREENING BILATERAL MAMMOGRAM WITH 3D TOMO WITH CAD
8 series · 8 of 24 positions shown · non-contrast
Comparison: Previous exam(s).

ACR Breast Density Category a: The breast tissue is almost entirely
fatty.

CLINICAL DATA: Screening.

EXAM:
2D DIGITAL SCREENING BILATERAL MAMMOGRAM WITH 3D TOMO WITH CAD

[L CC]
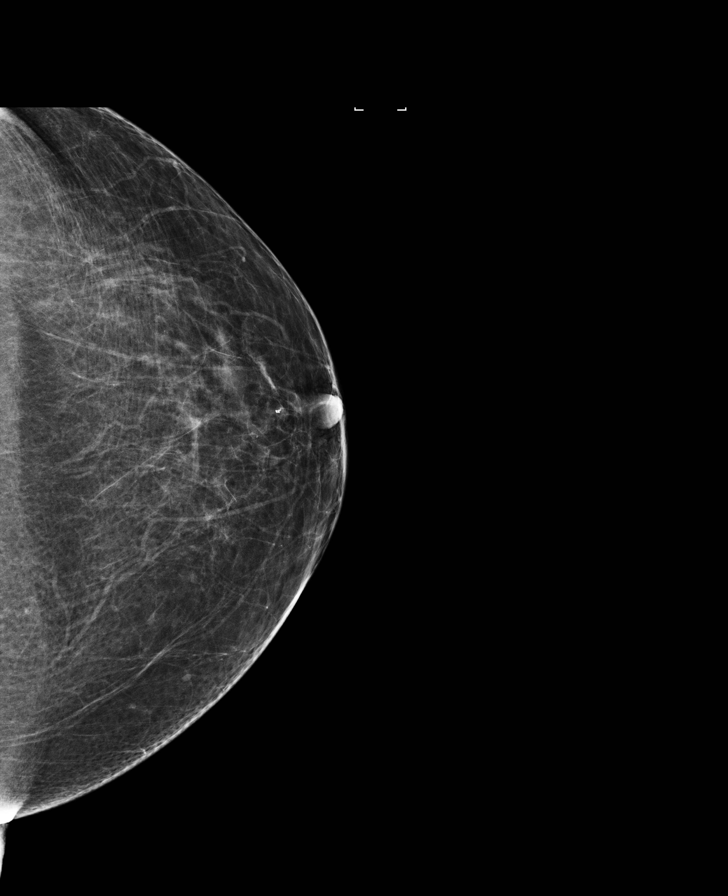

[R MLO]
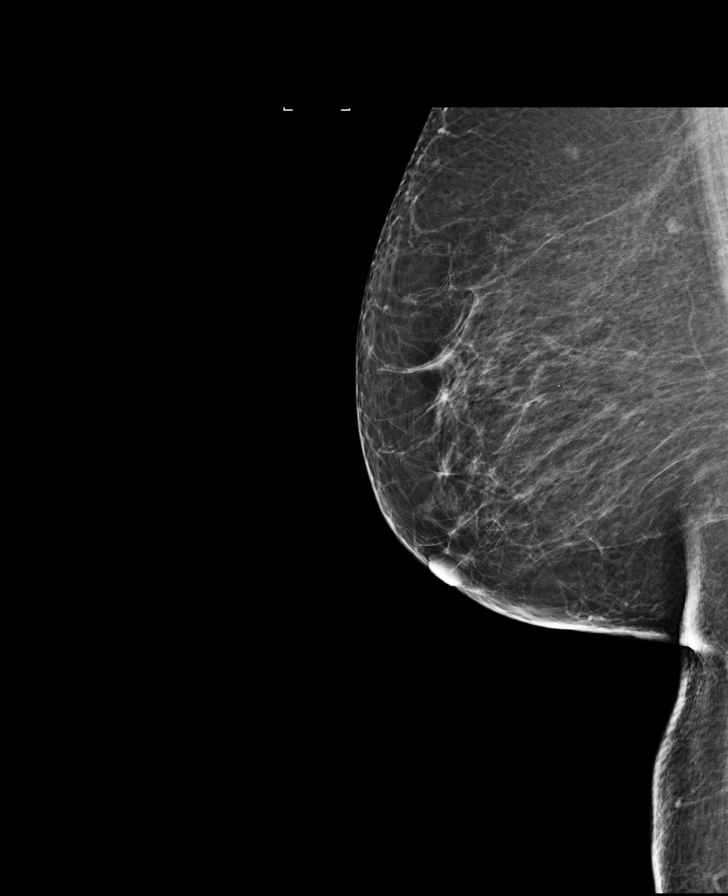

[L MLO]
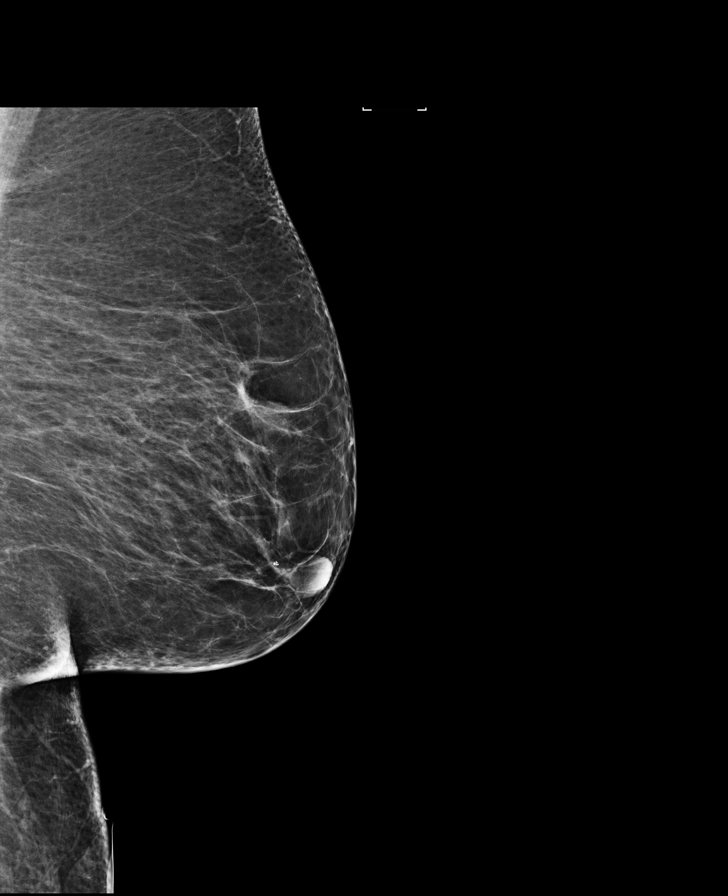

[R CC]
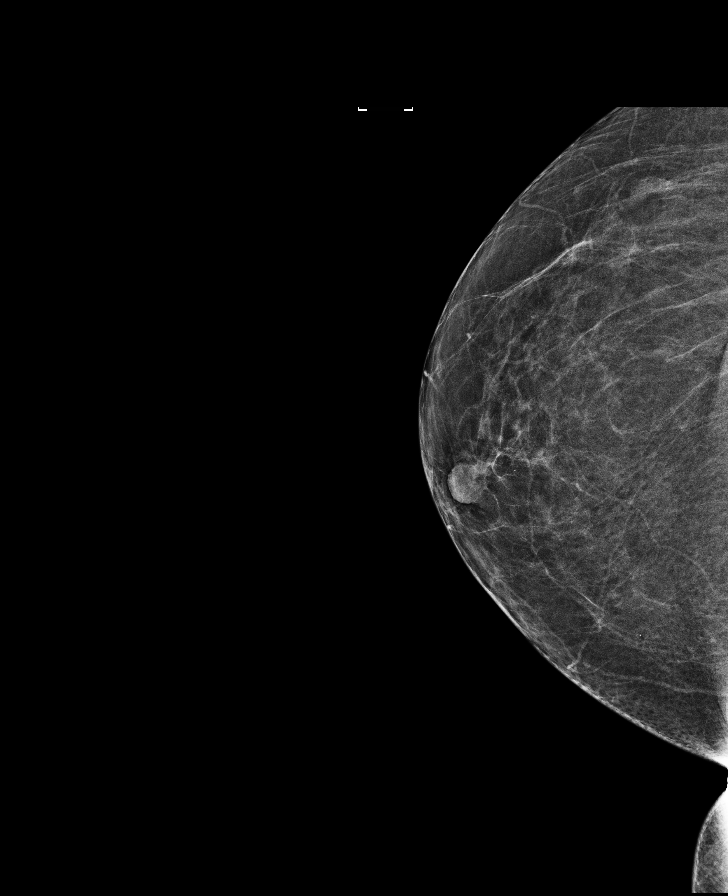

[L MLO tomo · tomo slice 34/67.0]
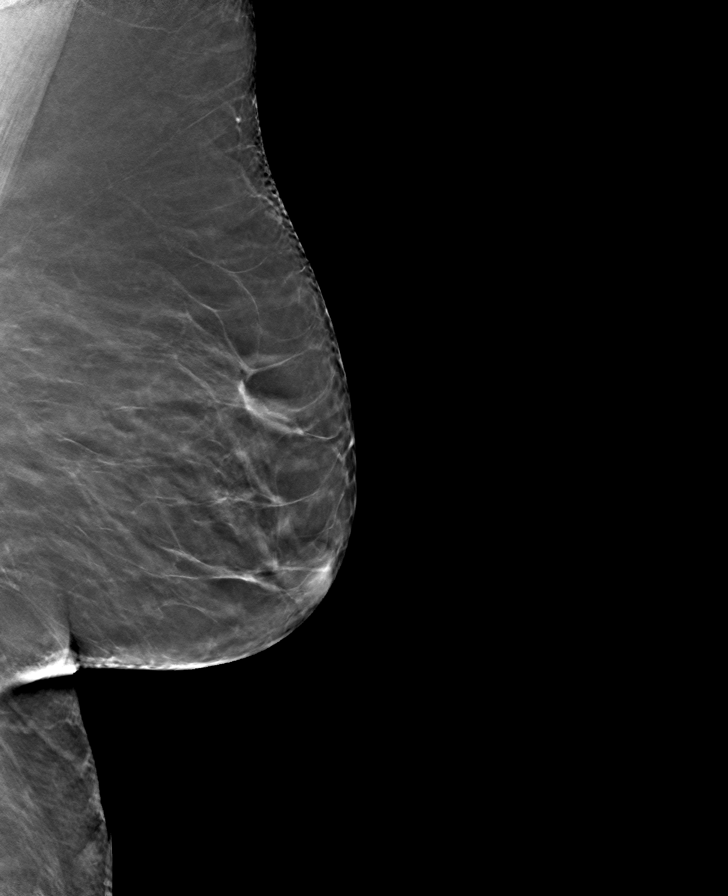

[R MLO tomo · tomo slice 36/71.0]
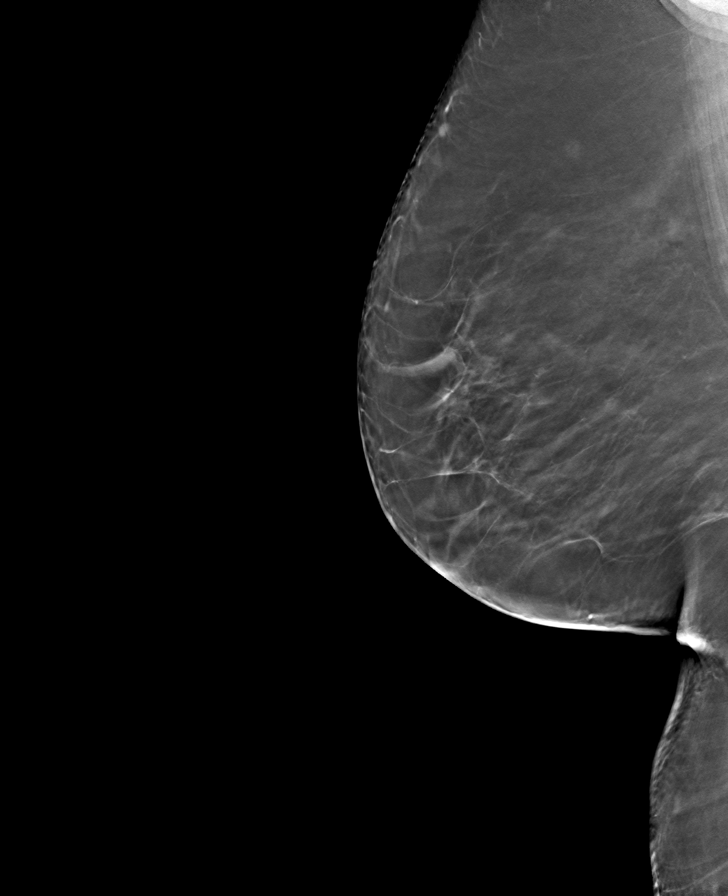

[L CC tomo · tomo slice 33/64.0]
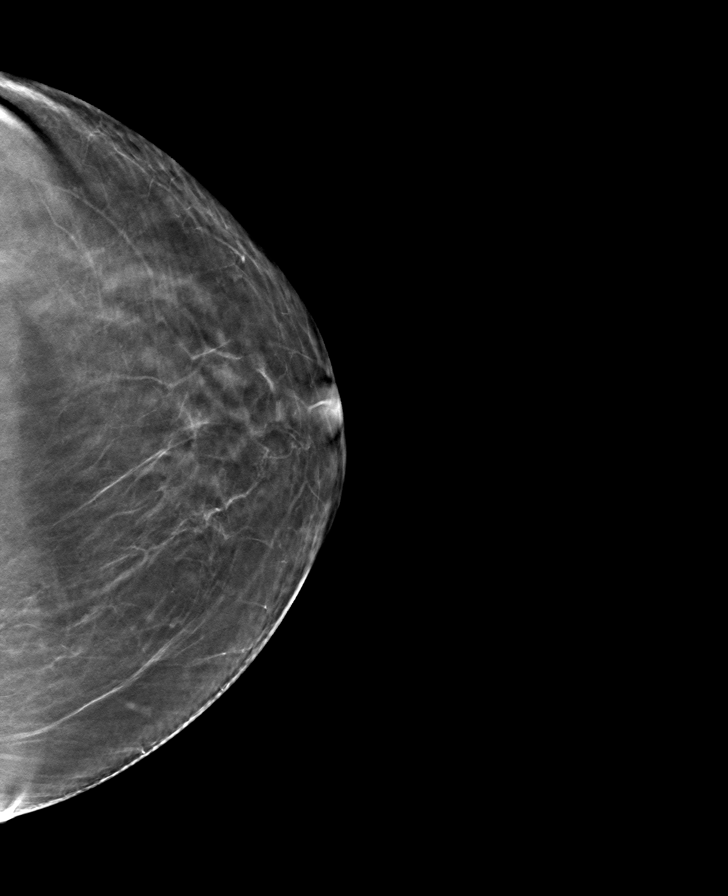

[R CC tomo · tomo slice 31/62.0]
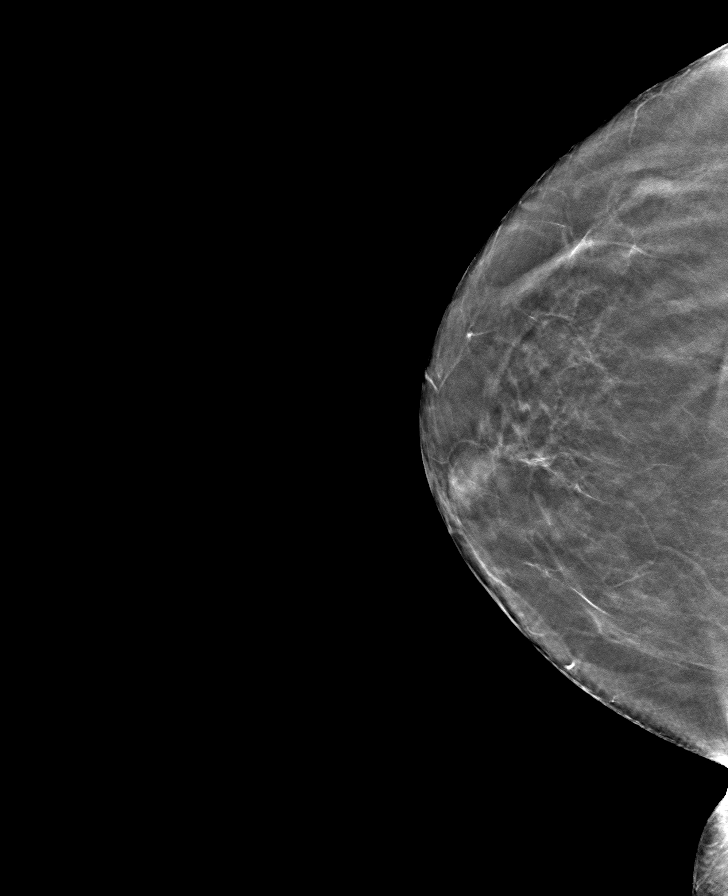

[8 of 24 positions shown; findings below may reference images not displayed]

FINDINGS: In the right breast, a possible asymmetry warrants further
evaluation. In the left breast, no findings suspicious for
malignancy. Images were processed with CAD.
IMPRESSION: Further evaluation is suggested for possible asymmetry in the right
breast.

RECOMMENDATION:
Diagnostic mammogram and possibly ultrasound of the right breast.
(Code:[id number])

The patient will be contacted regarding the findings, and additional
imaging will be scheduled.

BI-RADS CATEGORY  0: Incomplete. Need additional imaging evaluation
and/or prior mammograms for comparison.

## 2019-12-02 DIAGNOSIS — G4733 Obstructive sleep apnea (adult) (pediatric): Secondary | ICD-10-CM | POA: Diagnosis not present

## 2019-12-02 DIAGNOSIS — Z0000 Encounter for general adult medical examination without abnormal findings: Secondary | ICD-10-CM | POA: Diagnosis not present

## 2019-12-02 DIAGNOSIS — Z1389 Encounter for screening for other disorder: Secondary | ICD-10-CM | POA: Diagnosis not present

## 2019-12-02 DIAGNOSIS — Z6841 Body Mass Index (BMI) 40.0 and over, adult: Secondary | ICD-10-CM | POA: Diagnosis not present

## 2019-12-02 DIAGNOSIS — Z01810 Encounter for preprocedural cardiovascular examination: Secondary | ICD-10-CM | POA: Diagnosis not present

## 2019-12-02 DIAGNOSIS — K922 Gastrointestinal hemorrhage, unspecified: Secondary | ICD-10-CM | POA: Diagnosis not present

## 2019-12-03 DIAGNOSIS — M25551 Pain in right hip: Secondary | ICD-10-CM | POA: Diagnosis not present

## 2019-12-03 DIAGNOSIS — M1611 Unilateral primary osteoarthritis, right hip: Secondary | ICD-10-CM | POA: Diagnosis not present

## 2020-01-03 DIAGNOSIS — G4730 Sleep apnea, unspecified: Secondary | ICD-10-CM | POA: Diagnosis not present

## 2020-01-03 DIAGNOSIS — G4733 Obstructive sleep apnea (adult) (pediatric): Secondary | ICD-10-CM | POA: Diagnosis not present

## 2020-01-05 DIAGNOSIS — G894 Chronic pain syndrome: Secondary | ICD-10-CM | POA: Diagnosis not present

## 2020-01-05 DIAGNOSIS — Z6841 Body Mass Index (BMI) 40.0 and over, adult: Secondary | ICD-10-CM | POA: Diagnosis not present

## 2020-02-03 DIAGNOSIS — G894 Chronic pain syndrome: Secondary | ICD-10-CM | POA: Diagnosis not present

## 2020-02-03 DIAGNOSIS — Z6841 Body Mass Index (BMI) 40.0 and over, adult: Secondary | ICD-10-CM | POA: Diagnosis not present

## 2020-02-07 ENCOUNTER — Encounter: Payer: Self-pay | Admitting: Internal Medicine

## 2020-03-09 DIAGNOSIS — Z6841 Body Mass Index (BMI) 40.0 and over, adult: Secondary | ICD-10-CM | POA: Diagnosis not present

## 2020-03-09 DIAGNOSIS — G894 Chronic pain syndrome: Secondary | ICD-10-CM | POA: Diagnosis not present

## 2020-03-15 ENCOUNTER — Ambulatory Visit: Payer: Medicare Other | Admitting: Internal Medicine

## 2020-04-17 DIAGNOSIS — Z1389 Encounter for screening for other disorder: Secondary | ICD-10-CM | POA: Diagnosis not present

## 2020-04-17 DIAGNOSIS — Z6839 Body mass index (BMI) 39.0-39.9, adult: Secondary | ICD-10-CM | POA: Diagnosis not present

## 2020-04-17 DIAGNOSIS — G894 Chronic pain syndrome: Secondary | ICD-10-CM | POA: Diagnosis not present

## 2020-04-20 DIAGNOSIS — M1611 Unilateral primary osteoarthritis, right hip: Secondary | ICD-10-CM | POA: Diagnosis not present

## 2020-04-20 DIAGNOSIS — M25551 Pain in right hip: Secondary | ICD-10-CM | POA: Diagnosis not present

## 2020-04-26 ENCOUNTER — Ambulatory Visit: Payer: Medicare Other | Admitting: Internal Medicine

## 2020-05-09 NOTE — Patient Instructions (Addendum)
DUE TO COVID-19 ONLY ONE VISITOR ARE ALLOWED TO COME WITH YOU AND STAY IN THE WAITING ROOM ONLY DURING PRE OP AND PROCEDURE. THEN TWO VISITORS MAY VISIT WITH YOU IN YOUR PRIVATE ROOM DURING VISITING HOURS ONLY!!   COVID SWAB TESTING MUST BE COMPLETED ON: Tuesday, May 23, 2020 at 9:50 AM [address] [place] [facility] [place] [facility] enter pre surgical testing line (Must self quarantine after testing. Follow instructions on handout.)             Your procedure is scheduled on: Wednesday, May 24, 2020   Report to [facility] Main  Entrance    Report to admitting at 6:30 AM   Call this number if you have problems the morning of surgery [phone]   Bring CPAP mask and tubing day of surgery   Do not eat food  :After Midnight.   May have liquids until 5:30 AM day of surgery   CLEAR LIQUID DIET  Foods Allowed                                                                     Foods Excluded  Water, Black Coffee and tea, regular and decaf                             liquids that you cannot  Plain Jell-O in any flavor  (No red)                                           see through such as: Fruit ices (not with fruit pulp)                                     milk, soups, orange juice  Iced Popsicles (No red)                                    All solid food Carbonated beverages, regular and diet                                    Apple juices Sports drinks like Gatorade (No red) Lightly seasoned clear broth or consume(fat free) Sugar, honey syrup  Sample Menu Breakfast                                Lunch                                     Supper Cranberry juice                    Beef broth  Chicken broth Jell-O                                     Grape juice                           Apple juice Coffee or tea                        Jell-O                                      Popsicle  Coffee or tea                        Coffee or tea   Complete one Ensure drink the morning of surgery at 5:30 AM the day of surgery.   Oral Hygiene is also important to reduce your risk of infection.                                    Remember - BRUSH YOUR TEETH THE MORNING OF SURGERY WITH YOUR REGULAR TOOTHPASTE   Do NOT smoke after Midnight   Take these medicines the morning of surgery with A SIP OF WATER: Escitalopram, Omeprazole              You may not have any metal on your body including hair pins, jewelry, and body piercings             Do not wear make-up, lotions, powders, perfumes/cologne, or deodorant             Do not wear nail polish.  Do not shave  48 hours prior to surgery.                Do not bring valuables to the hospital. Fontanet.   Contacts, dentures or bridgework may not be worn into surgery.   Bring small overnight bag day of surgery.    Patients discharged the day of surgery will not be allowed to drive home.   Special Instructions: Bring a copy of your healthcare power of attorney and living will documents         the day of surgery if you haven't scanned them in before.              Please read over the following fact sheets you were given: IF YOU HAVE QUESTIONS ABOUT YOUR PRE OP INSTRUCTIONS PLEASE CALL [phone]   [facility] - Preparing for Surgery Before surgery, you can play an important role.  Because skin is not sterile, your skin needs to be as free of germs as possible.  You can reduce the number of germs on your skin by washing with CHG (chlorahexidine gluconate) soap before surgery.  CHG is an antiseptic cleaner which kills germs and bonds with the skin to continue killing germs even after washing. Please DO NOT use if you have an allergy to CHG or antibacterial soaps.  If your skin becomes reddened/irritated stop using the CHG and inform your nurse when you arrive at Short  Stay. Do not shave  (including legs and underarms) for at least 48 hours prior to the first CHG shower.  You may shave your face/neck.  Please follow these instructions carefully:  1.  Shower with CHG Soap the night before surgery and the  morning of surgery.  2.  If you choose to wash your hair, wash your hair first as usual with your normal  shampoo.  3.  After you shampoo, rinse your hair and body thoroughly to remove the shampoo.                             4.  Use CHG as you would any other liquid soap.  You can apply chg directly to the skin and wash.  Gently with a scrungie or clean washcloth.  5.  Apply the CHG Soap to your body ONLY FROM THE NECK DOWN.   Do   not use on face/ open                           Wound or open sores. Avoid contact with eyes, ears mouth and   genitals (private parts).                       Wash face,  Genitals (private parts) with your normal soap.             6.  Wash thoroughly, paying special attention to the area where your    surgery  will be performed.  7.  Thoroughly rinse your body with warm water from the neck down.  8.  DO NOT shower/wash with your normal soap after using and rinsing off the CHG Soap.                9.  Pat yourself dry with a clean towel.            10.  Wear clean pajamas.            11.  Place clean sheets on your bed the night of your first shower and do not  sleep with pets. Day of Surgery : Do not apply any lotions/deodorants the morning of surgery.  Please wear clean clothes to the hospital/surgery center.  FAILURE TO FOLLOW THESE INSTRUCTIONS MAY RESULT IN THE CANCELLATION OF YOUR SURGERY  PATIENT SIGNATURE_________________________________  NURSE SIGNATURE__________________________________  ________________________________________________________________________   [name]  An incentive spirometer is a tool that can help keep your lungs clear and active. This tool measures how well you are filling your lungs with each breath.  Taking long deep breaths may help reverse or decrease the chance of developing breathing (pulmonary) problems (especially infection) following:  A long period of time when you are unable to move or be active. BEFORE THE PROCEDURE   If the spirometer includes an indicator to show your best effort, your nurse or respiratory therapist will set it to a desired goal.  If possible, sit up straight or lean slightly forward. Try not to slouch.  Hold the incentive spirometer in an upright position. INSTRUCTIONS FOR USE  1. Sit on the edge of your bed if possible, or sit up as far as you can in bed or on a chair. 2. Hold the incentive spirometer in an upright position. 3. Breathe out normally. 4. Place the mouthpiece in your mouth and seal your lips tightly around it. 5. Breathe in  slowly and as deeply as possible, raising the piston or the ball toward the top of the column. 6. Hold your breath for 3-5 seconds or for as long as possible. Allow the piston or ball to fall to the bottom of the column. 7. Remove the mouthpiece from your mouth and breathe out normally. 8. Rest for a few seconds and repeat Steps 1 through 7 at least 10 times every 1-2 hours when you are awake. Take your time and take a few normal breaths between deep breaths. 9. The spirometer may include an indicator to show your best effort. Use the indicator as a goal to work toward during each repetition. 10. After each set of 10 deep breaths, practice coughing to be sure your lungs are clear. If you have an incision (the cut made at the time of surgery), support your incision when coughing by placing a pillow or rolled up towels firmly against it. Once you are able to get out of bed, walk around indoors and cough well. You may stop using the incentive spirometer when instructed by your caregiver.  RISKS AND COMPLICATIONS  Take your time so you do not get dizzy or light-headed.  If you are in pain, you may need to take or ask for pain  medication before doing incentive spirometry. It is harder to take a deep breath if you are having pain. AFTER USE  Rest and breathe slowly and easily.  It can be helpful to keep track of a log of your progress. Your caregiver can provide you with a simple table to help with this. If you are using the spirometer at home, follow these instructions: [place] IF:   You are having difficultly using the spirometer.  You have trouble using the spirometer as often as instructed.  Your pain medication is not giving enough relief while using the spirometer.  You develop fever of 100.5 F (38.1 C) or higher. SEEK IMMEDIATE MEDICAL CARE IF:   You cough up bloody sputum that had not been present before.  You develop fever of 102 F (38.9 C) or greater.  You develop worsening pain at or near the incision site. MAKE SURE YOU:   Understand these instructions.  Will watch your condition.  Will get help right away if you are not doing well or get worse. Document Released: 04/21/2007 Document Revised: 03/02/2012 Document Reviewed: 06/22/2007 ExitCare Patient Information 2014 [employer], [place].   ________________________________________________________________________  WHAT IS A BLOOD TRANSFUSION? Blood Transfusion Information  A transfusion is the replacement of blood or some of its parts. Blood is made up of multiple cells which provide different functions.  Red blood cells carry oxygen and are used for blood loss replacement.  White blood cells fight against infection.  Platelets control bleeding.  Plasma helps clot blood.  Other blood products are available for specialized needs, such as hemophilia or other clotting disorders. BEFORE THE TRANSFUSION  Who gives blood for transfusions?   Healthy volunteers who are fully evaluated to make sure their blood is safe. This is blood bank blood. Transfusion therapy is the safest it has ever been in the practice of medicine.  Before blood is taken from a donor, a complete history is taken to make sure that person has no history of diseases nor engages in risky social behavior (examples are intravenous drug use or sexual activity with multiple partners). The donor's travel history is screened to minimize risk of transmitting infections, such as malaria. The donated blood is tested for  signs of infectious diseases, such as HIV and hepatitis. The blood is then tested to be sure it is compatible with you in order to minimize the chance of a transfusion reaction. If you or a relative donates blood, this is often done in anticipation of surgery and is not appropriate for emergency situations. It takes many days to process the donated blood. RISKS AND COMPLICATIONS Although transfusion therapy is very safe and saves many lives, the main dangers of transfusion include:   Getting an infectious disease.  Developing a transfusion reaction. This is an allergic reaction to something in the blood you were given. Every precaution is taken to prevent this. The decision to have a blood transfusion has been considered carefully by your caregiver before blood is given. Blood is not given unless the benefits outweigh the risks. AFTER THE TRANSFUSION  Right after receiving a blood transfusion, you will usually feel much better and more energetic. This is especially true if your red blood cells have gotten low (anemic). The transfusion raises the level of the red blood cells which carry oxygen, and this usually causes an energy increase.  The nurse administering the transfusion will monitor you carefully for complications. HOME CARE INSTRUCTIONS  No special instructions are needed after a transfusion. You may find your energy is better. Speak with your caregiver about any limitations on activity for underlying diseases you may have. SEEK MEDICAL CARE IF:   Your condition is not improving after your transfusion.  You develop redness or  irritation at the intravenous (IV) site. SEEK IMMEDIATE MEDICAL CARE IF:  Any of the following symptoms occur over the next 12 hours:  Shaking chills.  You have a temperature by mouth above 102 F (38.9 C), not controlled by medicine.  Chest, back, or muscle pain.  People around you feel you are not acting correctly or are confused.  Shortness of breath or difficulty breathing.  Dizziness and fainting.  You get a rash or develop hives.  You have a decrease in urine output.  Your urine turns a dark color or changes to pink, red, or brown. Any of the following symptoms occur over the next 10 days:  You have a temperature by mouth above 102 F (38.9 C), not controlled by medicine.  Shortness of breath.  Weakness after normal activity.  The white part of the eye turns yellow (jaundice).  You have a decrease in the amount of urine or are urinating less often.  Your urine turns a dark color or changes to pink, red, or brown. Document Released: 12/06/2000 Document Revised: 03/02/2012 Document Reviewed: 07/25/2008 [facility] Patient Information 2014 [place], [place].  _______________________________________________________________________

## 2020-05-15 ENCOUNTER — Other Ambulatory Visit: Payer: Self-pay

## 2020-05-15 ENCOUNTER — Encounter (HOSPITAL_COMMUNITY)
Admission: RE | Admit: 2020-05-15 | Discharge: 2020-05-15 | Disposition: A | Discharge status: Home / Self Care | Payer: Medicare Other | Source: Ambulatory Visit | Attending: Orthopedic Surgery | Admitting: Orthopedic Surgery

## 2020-05-15 ENCOUNTER — Encounter (HOSPITAL_COMMUNITY): Payer: Self-pay

## 2020-05-15 DIAGNOSIS — R9431 Abnormal electrocardiogram [ECG] [EKG]: Secondary | ICD-10-CM | POA: Diagnosis not present

## 2020-05-15 DIAGNOSIS — Z01818 Encounter for other preprocedural examination: Secondary | ICD-10-CM | POA: Insufficient documentation

## 2020-05-15 HISTORY — DX: Unspecified urinary incontinence: R32

## 2020-05-15 HISTORY — DX: Asymptomatic varicose veins of unspecified lower extremity: I83.90

## 2020-05-15 HISTORY — DX: Obstructive sleep apnea (adult) (pediatric): Z99.89

## 2020-05-15 HISTORY — DX: Obstructive sleep apnea (adult) (pediatric): G47.33

## 2020-05-15 LAB — CBC
HCT: 45 % (ref 36.0–46.0)
Hemoglobin: 14.4 g/dL (ref 12.0–15.0)
MCH: 31.8 pg (ref 26.0–34.0)
MCHC: 32 g/dL (ref 30.0–36.0)
MCV: 99.3 fL (ref 80.0–100.0)
Platelets: 151 10*3/uL (ref 150–400)
RBC: 4.53 MIL/uL (ref 3.87–5.11)
RDW: 13.2 % (ref 11.5–15.5)
WBC: 8.7 10*3/uL (ref 4.0–10.5)
nRBC: 0 % (ref 0.0–0.2)

## 2020-05-15 LAB — COMPREHENSIVE METABOLIC PANEL
ALT: 16 U/L (ref 0–44)
AST: 16 U/L (ref 15–41)
Albumin: 3.9 g/dL (ref 3.5–5.0)
Alkaline Phosphatase: 76 U/L (ref 38–126)
Anion gap: 10 (ref 5–15)
BUN: 27 mg/dL — ABNORMAL HIGH (ref 8–23)
CO2: 27 mmol/L (ref 22–32)
Calcium: 9 mg/dL (ref 8.9–10.3)
Chloride: 103 mmol/L (ref 98–111)
Creatinine, Ser: 0.78 mg/dL (ref 0.44–1.00)
GFR calc Af Amer: >60 mL/min (ref >60)
GFR calc non Af Amer: >60 mL/min (ref >60)
Glucose, Bld: 96 mg/dL (ref 70–99)
Potassium: 4.1 mmol/L (ref 3.5–5.1)
Sodium: 140 mmol/L (ref 135–145)
Total Bilirubin: 0.4 mg/dL (ref 0.3–1.2)
Total Protein: 7.1 g/dL (ref 6.5–8.1)

## 2020-05-15 LAB — PROTIME-INR
INR: 1 (ref 0.8–1.2)
Prothrombin Time: 12.8 seconds (ref 11.4–15.2)

## 2020-05-15 LAB — SURGICAL PCR SCREEN
MRSA, PCR: NEGATIVE
Staphylococcus aureus: NEGATIVE

## 2020-05-15 LAB — APTT: aPTT: 29 seconds (ref 24–36)

## 2020-05-15 NOTE — Progress Notes (Signed)
Has completed COVID 19 vaccine  PCP - Dr. Eddie Candle 04/17/20 Cardiologist - Dr. Court Joy but not since 2016  Chest x-ray - greater than 1 year EKG - 05/15/20 in epic Stress Test - Greater than 2 years ECHO - Greater than 2 years Cardiac Cath - Greater than 2 years  Sleep Study - Greater than 2 years CPAP - Yes  Fasting Blood Sugar - N/A Checks Blood Sugar _N/A____ times a day  Blood Thinner Instructions: N/A Aspirin Instructions: N/A Last Dose: N/A  Anesthesia review: OSA, Afib, HTN  Patient denies shortness of breath, fever, cough and chest pain at PAT appointment   Patient verbalized understanding of instructions that were given to them at the PAT appointment. Patient was also instructed that they will need to review over the PAT instructions again at home before surgery.

## 2020-05-17 DIAGNOSIS — G4733 Obstructive sleep apnea (adult) (pediatric): Secondary | ICD-10-CM | POA: Diagnosis not present

## 2020-05-17 DIAGNOSIS — G894 Chronic pain syndrome: Secondary | ICD-10-CM | POA: Diagnosis not present

## 2020-05-17 DIAGNOSIS — I10 Essential (primary) hypertension: Secondary | ICD-10-CM | POA: Diagnosis not present

## 2020-05-17 DIAGNOSIS — Z6839 Body mass index (BMI) 39.0-39.9, adult: Secondary | ICD-10-CM | POA: Diagnosis not present

## 2020-05-17 NOTE — Progress Notes (Signed)
Anesthesia Chart Review   Case: [id number] Date/Time: 05/24/20 0815   Procedure: TOTAL HIP ARTHROPLASTY ANTERIOR APPROACH (Right Hip) - 171min   Anesthesia type: Choice   Pre-op diagnosis: right hip osteoarthritis   Location: [facility] ROOM 10 / [facility]   Surgeons: Gaynelle Arabian, MD      DISCUSSION:69 y.o. never smoker with h/o PAF, GERD, HLD, OSA on CPAP, right hip OA scheduled for above procedure 05/24/2020 with Dr. Gaynelle Arabian.   Pt presented with chest pain 04/03/2015, LexiScan found to be high risk.  Subsequent cardiac cath 04/14/2015 with widely patent coronary arteries.  False positive perfusion study.  No recurrent chest pain.   Clearance received from PCP, Dr. Hilma Favors, 05/17/2020 which states pt is moderate risk for planned procedure. Under reason for moderate risk the clearance states, "see note".  OV note requested and received which states pt is cleared for procedure.  I called Dr. Hilma Favors for clarification on moderate risk classification and he is out of town, on call provider would not comment.  Chronic pain syndrome currently taking Norco 5mg  q 4-6 hrs, Duragesic 89mcg/hr patch in place. VS: BP (!) 145/73   Pulse 66   Temp 36.9 C (Oral)   Resp 16   Ht 5' 2.5" (1.588 m)   Wt 99.8 kg   LMP  (LMP Unknown)   SpO2 95%   BMI 39.60 kg/m   PROVIDERS: Sharilyn Sites, MD is PCP    LABS: Labs reviewed: Acceptable for surgery. (all labs ordered are listed, but only abnormal results are displayed)  Labs Reviewed  COMPREHENSIVE METABOLIC PANEL - Abnormal; Notable for the following components:      Result Value   BUN 27 (*)    All other components within normal limits  SURGICAL PCR SCREEN  APTT  CBC  PROTIME-INR  TYPE AND SCREEN     IMAGES:   EKG: 05/15/2020 Rate 63 bpm Normal sinus rhythm  Nonspecific T wave abnormality No significant change since last tracing   CV: Echo 04/07/2015 Study Conclusions   - Left ventricle: The cavity size was normal. Wall thickness was   increased in a pattern of mild LVH. Systolic function was normal.  The estimated ejection fraction was in the range of 60% to 65%.  Wall motion was normal; there were no regional wall motion  abnormalities. Left ventricular diastolic function parameters  were normal for the patient&'s age.  - Mitral valve: Calcified annulus. There was trivial regurgitation.  - Right atrium: Central venous pressure (est): 3 mm Hg.  - Tricuspid valve: There was mild regurgitation.  - Pulmonary arteries: PA peak pressure: 37 mm Hg (S).  - Pericardium, extracardiac: There was no pericardial effusion.  Myocardial perfusion 04/07/2015 IMPRESSION: 1. Anterior and lateral, reversible perfusion defect as outlined above concerning for ischemia potentially in the LAD distribution, although breast attenuation is prominent.  2. Normal left ventricular wall motion.  3. Left ventricular ejection fraction 83%  4. High-risk stress test findings* based on defect size. Past Medical History:  Diagnosis Date  . Anxiety disorder   . Atrial fibrillation (HCC)    occ flutter but no issues with this now, off coumadin about 1 1/2 years now   . Blood transfusion without reported diagnosis   . Cataract    bilaterally removed twice   . Degenerative joint disease    Lumbosacral spine; status post right TKA  . Diverticulosis   . Fibromyalgia   . Gastroparesis   . GERD (gastroesophageal reflux disease)   .  Hearing loss    bilateral hearing aides  . Hyperlipidemia   . Hyperplastic colon polyp   . Hypertension   . Macular degeneration, dry    both eyes  . Obesity   . OSA on CPAP   . PUD (peptic ulcer disease)   . Urinary incontinence    wears pad  . Varicose vein of leg   . Vertigo    Right ear does not functions cause imbalance    Past Surgical History:  Procedure Laterality Date  . APPENDECTOMY  1968  . [place] I PROCEDURE  1970s; 2002   s/p [place] 1 1970 for peptic ulcer disease;  revision in  2002  . CATARACT EXTRACTION    . CHOLECYSTECTOMY  1972  . COCHLEAR IMPLANT Left   . COLONOSCOPY    . LAPAROSCOPIC TUBAL LIGATION    . LEFT HEART CATHETERIZATION WITH CORONARY ANGIOGRAM N/A 04/14/2015   Procedure: LEFT HEART CATHETERIZATION WITH CORONARY ANGIOGRAM;  Surgeon: Belva Crome, MD;  Location: [facility] C[facility] LAB;  Service: Cardiovascular;  Laterality: N/A;  . revision of rhuen-y procedure    . TONSILLECTOMY    . TOTAL ABDOMINAL HYSTERECTOMY     still has one ovary per pt.  Marland Kitchen TOTAL KNEE ARTHROPLASTY  05/2011   Right     MEDICATIONS: . amLODipine (NORVASC) 5 MG tablet  . celecoxib (CELEBREX) 100 MG capsule  . diltiazem (CARDIZEM CD) 180 MG 24 hr capsule  . escitalopram (LEXAPRO) 10 MG tablet  . fentaNYL (DURAGESIC - DOSED MCG/HR) 50 MCG/HR  . HYDROcodone-acetaminophen (NORCO/VICODIN) 5-325 MG tablet  . Multiple Vitamins-Minerals (PRESERVISION AREDS PO)  . nitroGLYCERIN (NITROSTAT) 0.4 MG SL tablet  . omeprazole (PRILOSEC) 40 MG capsule  . sucralfate (CARAFATE) 1 g tablet   No current facility-administered medications for this encounter.    Maia Plan [facility] Pre-Surgical Testing [phone] 05/23/20  12:05 PM

## 2020-05-17 NOTE — Progress Notes (Signed)
Surgical clearance from Dr. Hilma Favors 05/17/20 received and placed in chart.

## 2020-05-19 ENCOUNTER — Other Ambulatory Visit (HOSPITAL_COMMUNITY): Payer: Medicare Other

## 2020-05-23 ENCOUNTER — Other Ambulatory Visit (HOSPITAL_COMMUNITY)
Admission: RE | Admit: 2020-05-23 | Discharge: 2020-05-23 | Disposition: A | Discharge status: Home / Self Care | Payer: Medicare Other | Source: Ambulatory Visit | Attending: Orthopedic Surgery | Admitting: Orthopedic Surgery

## 2020-05-23 DIAGNOSIS — Z20822 Contact with and (suspected) exposure to covid-19: Secondary | ICD-10-CM | POA: Insufficient documentation

## 2020-05-23 DIAGNOSIS — Z01812 Encounter for preprocedural laboratory examination: Secondary | ICD-10-CM | POA: Diagnosis not present

## 2020-05-23 LAB — SARS CORONAVIRUS 2 (TAT 6-24 HRS): SARS Coronavirus 2: NEGATIVE

## 2020-05-23 NOTE — H&P (Signed)
TOTAL HIP ADMISSION H&P  Patient is admitted for right total hip arthroplasty.  Subjective:  Chief Complaint: right hip pain  HPI: [name], 69 y.o. female, has a history of pain and functional disability in the right hip(s) due to arthritis and patient has failed non-surgical conservative treatments for greater than 12 weeks to include corticosteriod injections and activity modification.  Onset of symptoms was gradual starting 2 years ago with gradually worsening course since that time.The patient noted no past surgery on the right hip(s).  Patient currently rates pain in the right hip at 7 out of 10 with activity. Patient has worsening of pain with activity and weight bearing and pain that interfers with activities of daily living. Patient has evidence of joint space narrowing by imaging studies. This condition presents safety issues increasing the risk of falls. There is no current active infection.  Patient Active Problem List   Diagnosis Date Noted  . Abnormal myocardial perfusion study 04/14/2015  . Hyperlipidemia 08/03/2010  . Hypertensive heart disease 08/03/2010  . FIBRILLATION, ATRIAL 08/03/2010  . DYSPNEA 08/03/2010  . COLONIC POLYPS, HX OF 01/01/2010  . OBESITY 09/14/2009  . Essential hypertension 09/14/2009  . Peptic ulcer disease 09/14/2009  . DEGENERATIVE JOINT DISEASE 09/14/2009  . FIBROMYALGIA 09/14/2009  . Obstructive sleep apnea 09/14/2009   Past Medical History:  Diagnosis Date  . Anxiety disorder   . Atrial fibrillation (HCC)    occ flutter but no issues with this now, off coumadin about 1 1/2 years now   . Blood transfusion without reported diagnosis   . Cataract    bilaterally removed twice   . Degenerative joint disease    Lumbosacral spine; status post right TKA  . Diverticulosis   . Fibromyalgia   . Gastroparesis   . GERD (gastroesophageal reflux disease)   . Hearing loss    bilateral hearing aides  . Hyperlipidemia   . Hyperplastic colon  polyp   . Hypertension   . Macular degeneration, dry    both eyes  . Obesity   . OSA on CPAP   . PUD (peptic ulcer disease)   . Urinary incontinence    wears pad  . Varicose vein of leg   . Vertigo    Right ear does not functions cause imbalance    Past Surgical History:  Procedure Laterality Date  . APPENDECTOMY  1968  . [place] I PROCEDURE  1970s; 2002   s/p Bilroth 1 1970 for peptic ulcer disease;  revision in 2002  . CATARACT EXTRACTION    . CHOLECYSTECTOMY  1972  . COCHLEAR IMPLANT Left   . COLONOSCOPY    . LAPAROSCOPIC TUBAL LIGATION    . LEFT HEART CATHETERIZATION WITH CORONARY ANGIOGRAM N/A 04/14/2015   Procedure: LEFT HEART CATHETERIZATION WITH CORONARY ANGIOGRAM;  Surgeon: Belva Crome, MD;  Location: [facility] CATH LAB;  Service: Cardiovascular;  Laterality: N/A;  . revision of rhuen-y procedure    . TONSILLECTOMY    . TOTAL ABDOMINAL HYSTERECTOMY     still has one ovary per pt.  Marland Kitchen TOTAL KNEE ARTHROPLASTY  05/2011   Right     No current facility-administered medications for this encounter.   Current Outpatient Medications  Medication Sig Dispense Refill Last Dose  . celecoxib (CELEBREX) 100 MG capsule Take 100 mg by mouth in the morning and at bedtime.     Marland Kitchen diltiazem (CARDIZEM CD) 180 MG 24 hr capsule Take 180 mg by mouth at bedtime.     Marland Kitchen escitalopram (  LEXAPRO) 10 MG tablet Take 10 mg by mouth every morning.     . fentaNYL (DURAGESIC - DOSED MCG/HR) 50 MCG/HR Place 50 mcg onto the skin every 3 (three) days.     Marland Kitchen HYDROcodone-acetaminophen (NORCO/VICODIN) 5-325 MG tablet Take 1 tablet by mouth every 6 (six) hours as needed for pain.      . Multiple Vitamins-Minerals (PRESERVISION AREDS PO) Take 1 capsule by mouth 2 (two) times daily.     Marland Kitchen omeprazole (PRILOSEC) 40 MG capsule Take 40 mg by mouth daily.      . sucralfate (CARAFATE) 1 g tablet Take 1 g by mouth 3 (three) times daily before meals.     Marland Kitchen amLODipine (NORVASC) 5 MG tablet Take 1 tablet (5 mg total) by mouth  daily. (Patient not taking: Reported on 05/08/2020) 90 tablet 3 Not Taking at Unknown time  . nitroGLYCERIN (NITROSTAT) 0.4 MG SL tablet Place 1 tablet (0.4 mg total) under the tongue every 5 (five) minutes as needed for chest pain. (Patient not taking: Reported on 11/06/2015) 25 tablet 3    Allergies  Allergen Reactions  . Codeine Itching  . Penicillins Nausea And Vomiting  . Sulfonamide Derivatives Itching    REACTION: GI upset    Social History   Tobacco Use  . Smoking status: Never Smoker  . Smokeless tobacco: Never Used  Substance Use Topics  . Alcohol use: No    Alcohol/week: 0.0 standard drinks    Family History  Problem Relation Age of Onset  . Emphysema Father   . Stomach cancer Maternal Grandmother   . Colon cancer Neg Hx   . Esophageal cancer Neg Hx   . Rectal cancer Neg Hx      Review of Systems  Constitutional: Negative for chills and fever.  Respiratory: Negative for shortness of breath.   Cardiovascular: Negative for chest pain.  Gastrointestinal: Negative for nausea and vomiting.  Musculoskeletal: Positive for arthralgias.    Objective:  Physical Exam Patient is a 69 year old female.  Well nourished and well developed. General: Alert and oriented x3, cooperative and pleasant, no acute distress. Head: normocephalic, atraumatic, neck supple. Eyes: EOMI. Respiratory: breath sounds clear in all fields, no wheezing, rales, or rhonchi. Cardiovascular: Regular rate and rhythm, no murmurs, gallops or rubs. Abdomen: non-tender to palpation and soft, normoactive bowel sounds.  Musculoskeletal:  Right hip Range of Motion: Flexion to 90 degrees. No internal rotation. External rotation to 20 degrees. Abduction to 20 degrees with pain. No tenderness to palpation at the trochanteric bursa. No pain with provocative testing of the hip.  Calves soft and nontender. Motor function intact in LE. Strength 5/5 LE bilaterally. Neuro: Distal pulses 2+. Sensation to  light touch intact in LE.  Vital signs in last 24 hours:    Labs:   Estimated body mass index is 39.6 kg/m as calculated from the following:   Height as of 05/15/20: 5' 2.5" (1.588 m).   Weight as of 05/15/20: 99.8 kg.   Imaging Review Plain radiographs demonstrate severe degenerative joint disease of the right hip(s). The bone quality appears to be adequate for age and reported activity level.  Assessment/Plan:  End stage arthritis, right hip(s)  The patient history, physical examination, clinical judgement of the provider and imaging studies are consistent with end stage degenerative joint disease of the right hip(s) and total hip arthroplasty is deemed medically necessary. The treatment options including medical management, injection therapy, arthroscopy and arthroplasty were discussed at length. The risks and benefits of  total hip arthroplasty were presented and reviewed. The risks due to aseptic loosening, infection, stiffness, dislocation/subluxation,  thromboembolic complications and other imponderables were discussed.  The patient acknowledged the explanation, agreed to proceed with the plan and consent was signed. Patient is being admitted for inpatient treatment for surgery, pain control, PT, OT, prophylactic antibiotics, VTE prophylaxis, progressive ambulation and ADL's and discharge planning.The patient is planning to be discharged home.  Therapy Plans: HEP Disposition: Home with husband Planned DVT Prophylaxis: aspirin 325mg  BID DME needed: none PCP: Dr. Hilma Favors, awaiting clearance TXA: IV Allergies: PCN- rash, nausea , Sulfa - stomach irritation, hx of ulcer Anesthesia Concerns: none BMI: 38.9 Not diabetic.  Other: Has coclear implant -- no hearing without it. Hx of gastric ulcer. Wears fentanyl patch 50 mcg/hr patch, changes every 72 hours. Takes Norco 5 PRN.  With last TKA, told she has hemangioma and anesthesiologist would not do spinal.   - Patient was  instructed on what medications to stop prior to surgery. - Follow-up visit in 2 weeks with Dr. Wynelle Link - Begin physical therapy following surgery - Pre-operative lab work as pre-surgical testing - Prescriptions will be provided in hospital at time of discharge  Griffith Citron, PA-C Orthopedic Surgery [facility] [phone]

## 2020-05-23 NOTE — Anesthesia Preprocedure Evaluation (Addendum)
Anesthesia Evaluation  Patient identified by MRN, date of birth, ID band Patient awake    Reviewed: Allergy & Precautions, NPO status , Patient's Chart, lab work & pertinent test results  Airway Mallampati: II  TM Distance: >3 FB Neck ROM: Full    Dental  (+) Dental Advisory Given   Pulmonary sleep apnea and Continuous Positive Airway Pressure Ventilation ,    breath sounds clear to auscultation       Cardiovascular hypertension, Pt. on medications + dysrhythmias Atrial Fibrillation  Rhythm:Regular Rate:Normal     Neuro/Psych  Neuromuscular disease    GI/Hepatic Neg liver ROS, PUD, GERD  Medicated,  Endo/Other  Morbid obesity  Renal/GU negative Renal ROS     Musculoskeletal  (+) Arthritis , Fibromyalgia -  Abdominal   Peds  Hematology negative hematology ROS (+)   Anesthesia Other Findings   Reproductive/Obstetrics                            Lab Results  Component Value Date   WBC 8.7 05/15/2020   HGB 14.4 05/15/2020   HCT 45.0 05/15/2020   MCV 99.3 05/15/2020   PLT 151 05/15/2020   Lab Results  Component Value Date   CREATININE 0.78 05/15/2020   BUN 27 (H) 05/15/2020   NA 140 05/15/2020   K 4.1 05/15/2020   CL 103 05/15/2020   CO2 27 05/15/2020   Lab Results  Component Value Date   INR 1.0 05/15/2020   INR 1.02 04/10/2015   INR 0.99 06/10/2011    Anesthesia Physical Anesthesia Plan  ASA: III  Anesthesia Plan: General   Post-op Pain Management:    Induction: Intravenous  PONV Risk Score and Plan: 3 and Dexamethasone, Ondansetron and Treatment may vary due to age or medical condition  Airway Management Planned: LMA and Oral ETT  Additional Equipment: None  Intra-op Plan:   Post-operative Plan: Extubation in OR  Informed Consent: I have reviewed the patients History and Physical, chart, labs and discussed the procedure including the risks, benefits and  alternatives for the proposed anesthesia with the patient or authorized representative who has indicated his/her understanding and acceptance.     Dental advisory given  Plan Discussed with: CRNA  Anesthesia Plan Comments:        Anesthesia Quick Evaluation

## 2020-05-24 ENCOUNTER — Ambulatory Visit (HOSPITAL_COMMUNITY): Payer: Medicare Other

## 2020-05-24 ENCOUNTER — Other Ambulatory Visit: Payer: Self-pay

## 2020-05-24 ENCOUNTER — Encounter (HOSPITAL_COMMUNITY): Payer: Self-pay | Admitting: Orthopedic Surgery

## 2020-05-24 ENCOUNTER — Encounter (HOSPITAL_COMMUNITY)
Admission: RE | Disposition: A | Discharge status: Home / Self Care | Payer: Self-pay | Source: Home / Self Care | Attending: Orthopedic Surgery

## 2020-05-24 ENCOUNTER — Ambulatory Visit (HOSPITAL_COMMUNITY): Payer: Medicare Other | Admitting: Physician Assistant

## 2020-05-24 ENCOUNTER — Ambulatory Visit (HOSPITAL_COMMUNITY): Payer: Medicare Other | Admitting: Anesthesiology

## 2020-05-24 ENCOUNTER — Ambulatory Visit (HOSPITAL_COMMUNITY)
Admission: RE | Admit: 2020-05-24 | Discharge: 2020-05-25 | Disposition: A | Discharge status: Home / Self Care | Payer: Medicare Other | Attending: Orthopedic Surgery | Admitting: Orthopedic Surgery

## 2020-05-24 DIAGNOSIS — Z882 Allergy status to sulfonamides status: Secondary | ICD-10-CM | POA: Insufficient documentation

## 2020-05-24 DIAGNOSIS — Z90710 Acquired absence of both cervix and uterus: Secondary | ICD-10-CM | POA: Diagnosis not present

## 2020-05-24 DIAGNOSIS — Z86010 Personal history of colonic polyps: Secondary | ICD-10-CM | POA: Insufficient documentation

## 2020-05-24 DIAGNOSIS — Z6839 Body mass index (BMI) 39.0-39.9, adult: Secondary | ICD-10-CM | POA: Insufficient documentation

## 2020-05-24 DIAGNOSIS — Z96651 Presence of right artificial knee joint: Secondary | ICD-10-CM | POA: Insufficient documentation

## 2020-05-24 DIAGNOSIS — G4733 Obstructive sleep apnea (adult) (pediatric): Secondary | ICD-10-CM | POA: Diagnosis not present

## 2020-05-24 DIAGNOSIS — I10 Essential (primary) hypertension: Secondary | ICD-10-CM | POA: Diagnosis not present

## 2020-05-24 DIAGNOSIS — Z885 Allergy status to narcotic agent status: Secondary | ICD-10-CM | POA: Diagnosis not present

## 2020-05-24 DIAGNOSIS — E785 Hyperlipidemia, unspecified: Secondary | ICD-10-CM | POA: Diagnosis not present

## 2020-05-24 DIAGNOSIS — I119 Hypertensive heart disease without heart failure: Secondary | ICD-10-CM | POA: Diagnosis not present

## 2020-05-24 DIAGNOSIS — Z9049 Acquired absence of other specified parts of digestive tract: Secondary | ICD-10-CM | POA: Diagnosis not present

## 2020-05-24 DIAGNOSIS — Z79899 Other long term (current) drug therapy: Secondary | ICD-10-CM | POA: Insufficient documentation

## 2020-05-24 DIAGNOSIS — K279 Peptic ulcer, site unspecified, unspecified as acute or chronic, without hemorrhage or perforation: Secondary | ICD-10-CM | POA: Insufficient documentation

## 2020-05-24 DIAGNOSIS — H353130 Nonexudative age-related macular degeneration, bilateral, stage unspecified: Secondary | ICD-10-CM | POA: Insufficient documentation

## 2020-05-24 DIAGNOSIS — M797 Fibromyalgia: Secondary | ICD-10-CM | POA: Insufficient documentation

## 2020-05-24 DIAGNOSIS — M1990 Unspecified osteoarthritis, unspecified site: Secondary | ICD-10-CM | POA: Diagnosis not present

## 2020-05-24 DIAGNOSIS — G709 Myoneural disorder, unspecified: Secondary | ICD-10-CM | POA: Diagnosis not present

## 2020-05-24 DIAGNOSIS — M169 Osteoarthritis of hip, unspecified: Secondary | ICD-10-CM | POA: Diagnosis present

## 2020-05-24 DIAGNOSIS — Z880 Allergy status to penicillin: Secondary | ICD-10-CM | POA: Diagnosis not present

## 2020-05-24 DIAGNOSIS — Z9621 Cochlear implant status: Secondary | ICD-10-CM | POA: Insufficient documentation

## 2020-05-24 DIAGNOSIS — Z825 Family history of asthma and other chronic lower respiratory diseases: Secondary | ICD-10-CM | POA: Insufficient documentation

## 2020-05-24 DIAGNOSIS — Z800 Family history of malignant neoplasm of digestive organs: Secondary | ICD-10-CM | POA: Insufficient documentation

## 2020-05-24 DIAGNOSIS — Z471 Aftercare following joint replacement surgery: Secondary | ICD-10-CM | POA: Diagnosis not present

## 2020-05-24 DIAGNOSIS — K219 Gastro-esophageal reflux disease without esophagitis: Secondary | ICD-10-CM | POA: Insufficient documentation

## 2020-05-24 DIAGNOSIS — M1611 Unilateral primary osteoarthritis, right hip: Secondary | ICD-10-CM | POA: Diagnosis present

## 2020-05-24 DIAGNOSIS — I4891 Unspecified atrial fibrillation: Secondary | ICD-10-CM | POA: Insufficient documentation

## 2020-05-24 DIAGNOSIS — R42 Dizziness and giddiness: Secondary | ICD-10-CM | POA: Diagnosis not present

## 2020-05-24 DIAGNOSIS — S72041A Displaced fracture of base of neck of right femur, initial encounter for closed fracture: Secondary | ICD-10-CM

## 2020-05-24 DIAGNOSIS — Z96649 Presence of unspecified artificial hip joint: Secondary | ICD-10-CM

## 2020-05-24 DIAGNOSIS — Z96641 Presence of right artificial hip joint: Secondary | ICD-10-CM | POA: Diagnosis not present

## 2020-05-24 HISTORY — PX: TOTAL HIP ARTHROPLASTY: SHX124

## 2020-05-24 LAB — TYPE AND SCREEN
ABO/RH(D): A POS
Antibody Screen: NEGATIVE

## 2020-05-24 SURGERY — ARTHROPLASTY, HIP, TOTAL, ANTERIOR APPROACH
Anesthesia: General | Site: Hip | Laterality: Right

## 2020-05-24 MED ORDER — WATER FOR IRRIGATION, STERILE IR SOLN
Status: DC | PRN
  Administered 2020-05-24: 2000 mL

## 2020-05-24 MED ORDER — DEXAMETHASONE SODIUM PHOSPHATE 10 MG/ML IJ SOLN
INTRAMUSCULAR | Status: AC
  Filled 2020-05-24: qty 1

## 2020-05-24 MED ORDER — DEXAMETHASONE SODIUM PHOSPHATE 10 MG/ML IJ SOLN
8.0000 mg | Freq: Once | INTRAMUSCULAR | Status: AC
  Administered 2020-05-24: 10 mg via INTRAVENOUS

## 2020-05-24 MED ORDER — LIDOCAINE 2% (20 MG/ML) 5 ML SYRINGE
INTRAMUSCULAR | Status: DC | PRN
  Administered 2020-05-24: 60 mg via INTRAVENOUS

## 2020-05-24 MED ORDER — PHENOL 1.4 % MT LIQD: 1.0000 | OROMUCOSAL | Status: DC | PRN

## 2020-05-24 MED ORDER — METHOCARBAMOL 500 MG PO TABS
500.0000 mg | ORAL_TABLET | Freq: Four times a day (QID) | ORAL | Status: DC | PRN
  Filled 2020-05-24: qty 1

## 2020-05-24 MED ORDER — ONDANSETRON HCL 4 MG/2ML IJ SOLN
INTRAMUSCULAR | Status: AC
  Filled 2020-05-24: qty 2

## 2020-05-24 MED ORDER — HYDROCODONE-ACETAMINOPHEN 5-325 MG PO TABS
1.0000 | ORAL_TABLET | ORAL | Status: DC | PRN
  Administered 2020-05-25: 2 via ORAL
  Filled 2020-05-24: qty 2

## 2020-05-24 MED ORDER — LIDOCAINE 2% (20 MG/ML) 5 ML SYRINGE
INTRAMUSCULAR | Status: AC
  Filled 2020-05-24: qty 5

## 2020-05-24 MED ORDER — ONDANSETRON HCL 4 MG PO TABS: 4.0000 mg | ORAL_TABLET | Freq: Four times a day (QID) | ORAL | Status: DC | PRN

## 2020-05-24 MED ORDER — CEFAZOLIN SODIUM-DEXTROSE 2-4 GM/100ML-% IV SOLN
2.0000 g | INTRAVENOUS | Status: AC
  Administered 2020-05-24: 2 g via INTRAVENOUS
  Filled 2020-05-24: qty 100

## 2020-05-24 MED ORDER — PROPOFOL 1000 MG/100ML IV EMUL
INTRAVENOUS | Status: AC
  Filled 2020-05-24: qty 100

## 2020-05-24 MED ORDER — BUPIVACAINE HCL 0.25 % IJ SOLN
INTRAMUSCULAR | Status: AC
  Filled 2020-05-24: qty 1

## 2020-05-24 MED ORDER — DOCUSATE SODIUM 100 MG PO CAPS
100.0000 mg | ORAL_CAPSULE | Freq: Two times a day (BID) | ORAL | Status: DC
  Administered 2020-05-24 – 2020-05-25 (×2): 100 mg via ORAL
  Filled 2020-05-24 (×2): qty 1

## 2020-05-24 MED ORDER — HYDROMORPHONE HCL 1 MG/ML IJ SOLN
INTRAMUSCULAR | Status: AC
  Filled 2020-05-24: qty 1

## 2020-05-24 MED ORDER — TRANEXAMIC ACID-NACL 1000-0.7 MG/100ML-% IV SOLN
1000.0000 mg | INTRAVENOUS | Status: AC
  Administered 2020-05-24: 1000 mg via INTRAVENOUS
  Filled 2020-05-24: qty 100

## 2020-05-24 MED ORDER — MIDAZOLAM HCL 5 MG/5ML IJ SOLN
INTRAMUSCULAR | Status: DC | PRN
  Administered 2020-05-24: 2 mg via INTRAVENOUS

## 2020-05-24 MED ORDER — BUPIVACAINE-EPINEPHRINE (PF) 0.25% -1:200000 IJ SOLN
INTRAMUSCULAR | Status: DC | PRN
  Administered 2020-05-24: 50 mL via PERINEURAL

## 2020-05-24 MED ORDER — RIVAROXABAN 10 MG PO TABS
10.0000 mg | ORAL_TABLET | Freq: Every day | ORAL | Status: DC
  Administered 2020-05-25: 10 mg via ORAL
  Filled 2020-05-24: qty 1

## 2020-05-24 MED ORDER — ESCITALOPRAM OXALATE 10 MG PO TABS
10.0000 mg | ORAL_TABLET | Freq: Every morning | ORAL | Status: DC
  Administered 2020-05-25: 10 mg via ORAL
  Filled 2020-05-24: qty 1

## 2020-05-24 MED ORDER — HYDROCODONE-ACETAMINOPHEN 7.5-325 MG PO TABS
1.0000 | ORAL_TABLET | ORAL | Status: DC | PRN
  Administered 2020-05-24: 2 via ORAL
  Filled 2020-05-24: qty 2

## 2020-05-24 MED ORDER — FENTANYL CITRATE (PF) 100 MCG/2ML IJ SOLN
INTRAMUSCULAR | Status: AC
  Filled 2020-05-24: qty 2

## 2020-05-24 MED ORDER — ACETAMINOPHEN 325 MG PO TABS: 325.0000 mg | ORAL_TABLET | Freq: Four times a day (QID) | ORAL | Status: DC | PRN

## 2020-05-24 MED ORDER — ONDANSETRON HCL 4 MG/2ML IJ SOLN: 4.0000 mg | Freq: Four times a day (QID) | INTRAMUSCULAR | Status: DC | PRN

## 2020-05-24 MED ORDER — ONDANSETRON HCL 4 MG/2ML IJ SOLN
INTRAMUSCULAR | Status: DC | PRN
  Administered 2020-05-24: 4 mg via INTRAVENOUS

## 2020-05-24 MED ORDER — PANTOPRAZOLE SODIUM 40 MG PO TBEC
80.0000 mg | DELAYED_RELEASE_TABLET | Freq: Every day | ORAL | Status: DC
  Administered 2020-05-25: 80 mg via ORAL
  Filled 2020-05-24: qty 2

## 2020-05-24 MED ORDER — ONDANSETRON HCL 4 MG/2ML IJ SOLN: 4.0000 mg | Freq: Once | INTRAMUSCULAR | Status: DC | PRN

## 2020-05-24 MED ORDER — METHOCARBAMOL 500 MG IVPB - SIMPLE MED
500.0000 mg | Freq: Four times a day (QID) | INTRAVENOUS | Status: DC | PRN
  Administered 2020-05-24: 500 mg via INTRAVENOUS
  Filled 2020-05-24: qty 50

## 2020-05-24 MED ORDER — HYDROMORPHONE HCL 1 MG/ML IJ SOLN
0.2500 mg | INTRAMUSCULAR | Status: DC | PRN
  Administered 2020-05-24 (×2): 0.5 mg via INTRAVENOUS

## 2020-05-24 MED ORDER — ROCURONIUM BROMIDE 10 MG/ML (PF) SYRINGE
PREFILLED_SYRINGE | INTRAVENOUS | Status: DC | PRN
  Administered 2020-05-24: 50 mg via INTRAVENOUS

## 2020-05-24 MED ORDER — POLYETHYLENE GLYCOL 3350 17 G PO PACK: 17.0000 g | PACK | Freq: Every day | ORAL | Status: DC | PRN

## 2020-05-24 MED ORDER — PROPOFOL 10 MG/ML IV BOLUS
INTRAVENOUS | Status: AC
  Filled 2020-05-24: qty 20

## 2020-05-24 MED ORDER — 0.9 % SODIUM CHLORIDE (POUR BTL) OPTIME
TOPICAL | Status: DC | PRN
  Administered 2020-05-24: 1000 mL

## 2020-05-24 MED ORDER — POVIDONE-IODINE 10 % EX SWAB
2.0000 "application " | Freq: Once | CUTANEOUS | Status: AC
  Administered 2020-05-24: 2 via TOPICAL

## 2020-05-24 MED ORDER — CEFAZOLIN SODIUM-DEXTROSE 2-4 GM/100ML-% IV SOLN
2.0000 g | Freq: Four times a day (QID) | INTRAVENOUS | Status: AC
  Administered 2020-05-24 (×2): 2 g via INTRAVENOUS
  Filled 2020-05-24 (×2): qty 100

## 2020-05-24 MED ORDER — SUCRALFATE 1 G PO TABS
1.0000 g | ORAL_TABLET | Freq: Three times a day (TID) | ORAL | Status: DC
  Administered 2020-05-24 – 2020-05-25 (×2): 1 g via ORAL
  Filled 2020-05-24 (×2): qty 1

## 2020-05-24 MED ORDER — PROPOFOL 10 MG/ML IV BOLUS
INTRAVENOUS | Status: DC | PRN
  Administered 2020-05-24: 50 mg via INTRAVENOUS
  Administered 2020-05-24: 150 mg via INTRAVENOUS

## 2020-05-24 MED ORDER — FENTANYL 50 MCG/HR TD PT72: 1.0000 | MEDICATED_PATCH | TRANSDERMAL | Status: DC

## 2020-05-24 MED ORDER — BISACODYL 10 MG RE SUPP: 10.0000 mg | Freq: Every day | RECTAL | Status: DC | PRN

## 2020-05-24 MED ORDER — MORPHINE SULFATE (PF) 2 MG/ML IV SOLN: 0.5000 mg | INTRAVENOUS | Status: DC | PRN

## 2020-05-24 MED ORDER — SUGAMMADEX SODIUM 200 MG/2ML IV SOLN
INTRAVENOUS | Status: DC | PRN
  Administered 2020-05-24: 200 mg via INTRAVENOUS

## 2020-05-24 MED ORDER — METOCLOPRAMIDE HCL 5 MG PO TABS: 5.0000 mg | ORAL_TABLET | Freq: Three times a day (TID) | ORAL | Status: DC | PRN

## 2020-05-24 MED ORDER — SODIUM CHLORIDE 0.9 % IV SOLN: INTRAVENOUS | Status: DC

## 2020-05-24 MED ORDER — DEXAMETHASONE SODIUM PHOSPHATE 10 MG/ML IJ SOLN
10.0000 mg | Freq: Once | INTRAMUSCULAR | Status: AC
  Administered 2020-05-25: 10 mg via INTRAVENOUS
  Filled 2020-05-24: qty 1

## 2020-05-24 MED ORDER — DILTIAZEM HCL ER COATED BEADS 180 MG PO CP24: 180.0000 mg | ORAL_CAPSULE | Freq: Every day | ORAL | Status: DC

## 2020-05-24 MED ORDER — FENTANYL CITRATE (PF) 250 MCG/5ML IJ SOLN
INTRAMUSCULAR | Status: AC
  Filled 2020-05-24: qty 5

## 2020-05-24 MED ORDER — FENTANYL 50 MCG/HR TD PT72
1.0000 | MEDICATED_PATCH | TRANSDERMAL | Status: DC
  Administered 2020-05-24: 1 via TRANSDERMAL
  Filled 2020-05-24: qty 1

## 2020-05-24 MED ORDER — FENTANYL CITRATE (PF) 100 MCG/2ML IJ SOLN
25.0000 ug | INTRAMUSCULAR | Status: DC | PRN
  Administered 2020-05-24 (×3): 50 ug via INTRAVENOUS

## 2020-05-24 MED ORDER — MENTHOL 3 MG MT LOZG: 1.0000 | LOZENGE | OROMUCOSAL | Status: DC | PRN

## 2020-05-24 MED ORDER — ROCURONIUM BROMIDE 10 MG/ML (PF) SYRINGE
PREFILLED_SYRINGE | INTRAVENOUS | Status: AC
  Filled 2020-05-24: qty 10

## 2020-05-24 MED ORDER — CHLORHEXIDINE GLUCONATE 0.12 % MT SOLN
5.0000 mL | Freq: Once | OROMUCOSAL | Status: AC
  Administered 2020-05-24: 5 mL via OROMUCOSAL

## 2020-05-24 MED ORDER — ACETAMINOPHEN 10 MG/ML IV SOLN
1000.0000 mg | Freq: Four times a day (QID) | INTRAVENOUS | Status: DC
  Administered 2020-05-24: 1000 mg via INTRAVENOUS
  Filled 2020-05-24: qty 100

## 2020-05-24 MED ORDER — MIDAZOLAM HCL 2 MG/2ML IJ SOLN
INTRAMUSCULAR | Status: AC
  Filled 2020-05-24: qty 2

## 2020-05-24 MED ORDER — MAGNESIUM CITRATE PO SOLN: 1.0000 | Freq: Once | ORAL | Status: DC | PRN

## 2020-05-24 MED ORDER — FENTANYL CITRATE (PF) 100 MCG/2ML IJ SOLN
INTRAMUSCULAR | Status: DC | PRN
  Administered 2020-05-24: 100 ug via INTRAVENOUS
  Administered 2020-05-24: 50 ug via INTRAVENOUS
  Administered 2020-05-24: 100 ug via INTRAVENOUS
  Administered 2020-05-24 (×2): 50 ug via INTRAVENOUS

## 2020-05-24 MED ORDER — METHOCARBAMOL 500 MG IVPB - SIMPLE MED
INTRAVENOUS | Status: AC
  Filled 2020-05-24: qty 50

## 2020-05-24 MED ORDER — METOCLOPRAMIDE HCL 5 MG/ML IJ SOLN: 5.0000 mg | Freq: Three times a day (TID) | INTRAMUSCULAR | Status: DC | PRN

## 2020-05-24 MED ORDER — LACTATED RINGERS IV SOLN: INTRAVENOUS | Status: DC

## 2020-05-24 SURGICAL SUPPLY — 49 items
BAG DECANTER FOR FLEXI CONT (MISCELLANEOUS) IMPLANT
BAG SPEC THK2 15X12 ZIP CLS (MISCELLANEOUS)
BAG ZIPLOCK 12X15 (MISCELLANEOUS) IMPLANT
BLADE SAG 18X100X1.27 (BLADE) ×2 IMPLANT
CLSR STERI-STRIP ANTIMIC 1/2X4 (GAUZE/BANDAGES/DRESSINGS) ×1 IMPLANT
COVER PERINEAL POST (MISCELLANEOUS) ×2 IMPLANT
COVER SURGICAL LIGHT HANDLE (MISCELLANEOUS) ×2 IMPLANT
COVER WAND RF STERILE (DRAPES) IMPLANT
CUP ACETBLR 48 OD SECTOR II (Hips) ×1 IMPLANT
DECANTER SPIKE VIAL GLASS SM (MISCELLANEOUS) ×2 IMPLANT
DRAPE STERI IOBAN 125X83 (DRAPES) ×2 IMPLANT
DRAPE U-SHAPE 47X51 STRL (DRAPES) ×4 IMPLANT
DRESSING AQUACEL AG SP 3.5X10 (GAUZE/BANDAGES/DRESSINGS) IMPLANT
DRSG ADAPTIC 3X8 NADH LF (GAUZE/BANDAGES/DRESSINGS) ×2 IMPLANT
DRSG AQUACEL AG ADV 3.5X10 (GAUZE/BANDAGES/DRESSINGS) ×2 IMPLANT
DRSG AQUACEL AG SP 3.5X10 (GAUZE/BANDAGES/DRESSINGS) ×2
DURAPREP 26ML APPLICATOR (WOUND CARE) ×2 IMPLANT
ELECT REM PT RETURN 15FT ADLT (MISCELLANEOUS) ×2 IMPLANT
EVACUATOR 1/8 PVC DRAIN (DRAIN) IMPLANT
GLOVE BIO SURGEON STRL SZ 6 (GLOVE) IMPLANT
GLOVE BIO SURGEON STRL SZ7 (GLOVE) IMPLANT
GLOVE BIO SURGEON STRL SZ8 (GLOVE) ×2 IMPLANT
GLOVE BIOGEL PI IND STRL 6.5 (GLOVE) IMPLANT
GLOVE BIOGEL PI IND STRL 7.0 (GLOVE) IMPLANT
GLOVE BIOGEL PI IND STRL 8 (GLOVE) ×1 IMPLANT
GLOVE BIOGEL PI INDICATOR 6.5 (GLOVE)
GLOVE BIOGEL PI INDICATOR 7.0 (GLOVE)
GLOVE BIOGEL PI INDICATOR 8 (GLOVE) ×1
GOWN STRL REUS W/TWL LRG LVL3 (GOWN DISPOSABLE) ×2 IMPLANT
GOWN STRL REUS W/TWL XL LVL3 (GOWN DISPOSABLE) IMPLANT
HEAD CERAMIC DELTA 28 P1.5 HIP (Head) ×1 IMPLANT
HEAD CERAMIC DELTA 28M 12/14P5 (Head) ×1 IMPLANT
HOLDER FOLEY CATH W/STRAP (MISCELLANEOUS) ×2 IMPLANT
KIT TURNOVER KIT A (KITS) IMPLANT
LINER MARATHON 28 48 (Hips) ×1 IMPLANT
MANIFOLD NEPTUNE II (INSTRUMENTS) ×2 IMPLANT
PACK ANTERIOR HIP CUSTOM (KITS) ×2 IMPLANT
PENCIL SMOKE EVACUATOR COATED (MISCELLANEOUS) ×2 IMPLANT
STEM FEMORAL SZ6 HIGH ACTIS (Stem) ×1 IMPLANT
STRIP CLOSURE SKIN 1/2X4 (GAUZE/BANDAGES/DRESSINGS) ×2 IMPLANT
SUT ETHIBOND NAB CT1 #1 30IN (SUTURE) ×2 IMPLANT
SUT MNCRL AB 4-0 PS2 18 (SUTURE) ×2 IMPLANT
SUT STRATAFIX 0 PDS 27 VIOLET (SUTURE) ×2
SUT VIC AB 2-0 CT1 27 (SUTURE) ×4
SUT VIC AB 2-0 CT1 TAPERPNT 27 (SUTURE) ×2 IMPLANT
SUTURE STRATFX 0 PDS 27 VIOLET (SUTURE) ×1 IMPLANT
SYR 50ML LL SCALE MARK (SYRINGE) IMPLANT
TRAY FOLEY MTR SLVR 16FR STAT (SET/KITS/TRAYS/PACK) ×2 IMPLANT
YANKAUER SUCT BULB TIP 10FT TU (MISCELLANEOUS) ×2 IMPLANT

## 2020-05-24 NOTE — Progress Notes (Signed)
Pts cpap setup with auto titrate settings.

## 2020-05-24 NOTE — Anesthesia Procedure Notes (Signed)
Procedure Name: Intubation Date/Time: 05/24/2020 8:29 AM Performed by: Talbot Grumbling, CRNA Pre-anesthesia Checklist: Patient identified, Emergency Drugs available, Suction available and Patient being monitored Patient Re-evaluated:Patient Re-evaluated prior to induction Oxygen Delivery Method: Circle system utilized Preoxygenation: Pre-oxygenation with 100% oxygen Induction Type: IV induction Ventilation: Mask ventilation without difficulty Laryngoscope Size: Mac and 3 Grade View: Grade II Tube type: Oral Tube size: 7.5 mm Number of attempts: 1 Airway Equipment and Method: Stylet Placement Confirmation: ETT inserted through vocal cords under direct vision,  positive ETCO2 and breath sounds checked- equal and bilateral Secured at: 21 cm Tube secured with: Tape Dental Injury: Teeth and Oropharynx as per pre-operative assessment

## 2020-05-24 NOTE — Evaluation (Signed)
Physical Therapy Evaluation Patient Details Name: [name] MRN: [id number] DOB: Apr 14, 1951 Today's Date: 05/24/2020   History of Present Illness  Patient is 69 y.o. female s/p Rt THA anterior approach on 05/24/20 with PMH significant for vertigo, obesity, HTN, HLD, HOH, GERD, fibromyalgia, A-fib, anxiety, Rt TKA in 2012.    Clinical Impression  [name] is a 69 y.o. female POD 0 s/p Rt THA. Patient reports modified independence with mobility at baseline. Patient is now limited by functional impairments (see PT problem list below) and requires min assist for transfers and gait with RW. Patient was able to ambulate ~80 feet with RW and min assist. Patient instructed in exercise to facilitate ROM and circulation. Patient will benefit from continued skilled PT interventions to address impairments and progress towards PLOF. Acute PT will follow to progress mobility and stair training in preparation for safe discharge home.     Follow Up Recommendations Follow surgeon's recommendation for DC plan and follow-up therapies    Equipment Recommendations  None recommended by PT    Recommendations for Other Services       Precautions / Restrictions Precautions Precautions: Fall Restrictions Weight Bearing Restrictions: No Other Position/Activity Restrictions: WBAT      Mobility  Bed Mobility Overal bed mobility: Needs Assistance Bed Mobility: Supine to Sit     Supine to sit: HOB elevated;Min assist     General bed mobility comments: cues for use of bed rail and assist for Rt LE mobility to bring to EOB.  Transfers Overall transfer level: Needs assistance Equipment used: Rolling walker (2 wheeled) Transfers: Sit to/from Stand Sit to Stand: Min assist         General transfer comment: cues for safe hand placement with RW. assist required for power up.   Ambulation/Gait Ambulation/Gait assistance: Min assist Gait Distance (Feet): 80 Feet Assistive device: Rolling walker  (2 wheeled) Gait Pattern/deviations: Step-to pattern;Decreased stride length;Trunk flexed Gait velocity: decreased   General Gait Details: cues for safe step pattern and proximity to RW. no overt LOB noted. pt rquired assist to manage RW.  Stairs         Wheelchair Mobility    Modified Rankin (Stroke Patients Only)       Balance Overall balance assessment: Needs assistance Sitting-balance support: Feet supported Sitting balance-Leahy Scale: Good     Standing balance support: During functional activity;Bilateral upper extremity supported Standing balance-Leahy Scale: Fair              Pertinent Vitals/Pain Pain Assessment: 0-10 Pain Score: 3  Pain Location: Rt hip Pain Descriptors / Indicators: Aching Pain Intervention(s): Monitored during session;Limited activity within patient's tolerance;Repositioned;Patient requesting pain meds-RN notified    Home Living Family/patient expects to be discharged to:: Private residence Living Arrangements: Spouse/significant other Available Help at Discharge: Family Type of Home: House Home Access: Stairs to enter Entrance Stairs-Rails: Right Entrance Stairs-Number of Steps: 2 Home Layout: One level;Laundry or work area in [place]: Environmental consultant - 2 wheels;Walker - 4 wheels;Cane - single point;Shower seat - built in;Hand held shower head;Grab bars - tub/shower;Wheelchair - power      Prior Function Level of Independence: Independent with assistive device(s);Needs assistance   Gait / Transfers Assistance Needed: pt using SPC in home and sometimes RW. she uses motorized wheelchair out in community.  ADL's / Homemaking Assistance Needed: pt's husband is helping her to get in/out of shower and she is able to bathe herself. She does some of the cleaning and cooking and  her husband does th laundry as it is located downstairs.        Hand Dominance   Dominant Hand: Right    Extremity/Trunk Assessment   Upper  Extremity Assessment Upper Extremity Assessment: Overall WFL for tasks assessed    Lower Extremity Assessment Lower Extremity Assessment: Overall WFL for tasks assessed    Cervical / Trunk Assessment Cervical / Trunk Assessment: Kyphotic  Communication   Communication: HOH;Deaf(deaf in Rt ear)  Cognition Arousal/Alertness: Awake/alert Behavior During Therapy: WFL for tasks assessed/performed Overall Cognitive Status: Within Functional Limits for tasks assessed               General Comments      Exercises Total Joint Exercises Ankle Circles/Pumps: AROM;Both;20 reps;Supine Quad Sets: AROM;5 reps;Right;Seated Heel Slides: AROM;5 reps;Right;Seated   Assessment/Plan    PT Assessment Patient needs continued PT services  PT Problem List Decreased strength;Decreased range of motion;Decreased activity tolerance;Decreased balance;Decreased mobility;Decreased knowledge of use of DME;Decreased knowledge of precautions;Pain       PT Treatment Interventions DME instruction;Gait training;Stair training;Functional mobility training;Therapeutic activities;Therapeutic exercise;Balance training;Patient/family education    PT Goals (Current goals can be found in the Care Plan section)  Acute Rehab PT Goals Patient Stated Goal: to be able to get her Rt leg better and walk more PT Goal Formulation: With patient Time For Goal Achievement: 05/31/20 Potential to Achieve Goals: Good    Frequency 7X/week    AM-PAC PT "6 Clicks" Mobility  Outcome Measure Help needed turning from your back to your side while in a flat bed without using bedrails?: A Little Help needed moving from lying on your back to sitting on the side of a flat bed without using bedrails?: A Little Help needed moving to and from a bed to a chair (including a wheelchair)?: A Little Help needed standing up from a chair using your arms (e.g., wheelchair or bedside chair)?: A Little Help needed to walk in hospital room?: A  Little Help needed climbing 3-5 steps with a railing? : A Little 6 Click Score: 18    End of Session Equipment Utilized During Treatment: Gait belt Activity Tolerance: Patient tolerated treatment well     PT Visit Diagnosis: Muscle weakness (generalized) (M62.81);Difficulty in walking, not elsewhere classified (R26.2)    Time: [id number] PT Time Calculation (min) (ACUTE ONLY): 34 min   Charges:   PT Evaluation $PT Eval Low Complexity: 1 Low PT Treatments $Therapeutic Exercise: 8-22 mins       Verner Mould, DPT Physical Therapist with [facility] [phone]  05/24/2020 3:05 PM

## 2020-05-24 NOTE — Transfer of Care (Signed)
Immediate Anesthesia Transfer of Care Note  Patient: [name]  Procedure(s) Performed: TOTAL HIP ARTHROPLASTY ANTERIOR APPROACH (Right Hip)  Patient Location: PACU  Anesthesia Type:General  Level of Consciousness: awake, alert  and oriented  Airway & Oxygen Therapy: Patient Spontanous Breathing and Patient connected to face mask oxygen  Post-op Assessment: Report given to RN and Post -op Vital signs reviewed and stable  Post vital signs: Reviewed and stable  Last Vitals:  Vitals Value Taken Time  BP 163/81 05/24/20 1015  Temp    Pulse 87 05/24/20 1016  Resp 17 05/24/20 1016  SpO2 100 % 05/24/20 1016  Vitals shown include unvalidated device data.  Last Pain:  Vitals:   05/24/20 0659  TempSrc: Oral         Complications: No apparent anesthesia complications

## 2020-05-24 NOTE — Op Note (Signed)
OPERATIVE REPORT- TOTAL HIP ARTHROPLASTY   PREOPERATIVE DIAGNOSIS: Osteoarthritis of the Right hip.   POSTOPERATIVE DIAGNOSIS: Osteoarthritis of the Right  hip.   PROCEDURE: Right total hip arthroplasty, anterior approach.   SURGEON: Gaynelle Arabian, MD   ASSISTANT: Griffith Citron, PA-C  ANESTHESIA:  General  ESTIMATED BLOOD LOSS:-250 mL    DRAINS: Hemovac x1.   COMPLICATIONS: None   CONDITION: PACU - hemodynamically stable.   BRIEF CLINICAL NOTE: [name] is a 69 y.o. female who has advanced end-  stage arthritis of their Right  hip with progressively worsening pain and  dysfunction.The patient has failed nonoperative management and presents for  total hip arthroplasty.   PROCEDURE IN DETAIL: After successful administration of spinal  anesthetic, the traction boots for the [facility] bed were placed on both  feet and the patient was placed onto the [facility] bed, boots placed into the leg  holders. The Right hip was then isolated from the perineum with plastic  drapes and prepped and draped in the usual sterile fashion. ASIS and  greater trochanter were marked and a oblique incision was made, starting  at about 1 cm lateral and 2 cm distal to the ASIS and coursing towards  the anterior cortex of the femur. The skin was cut with a 10 blade  through subcutaneous tissue to the level of the fascia overlying the  tensor fascia lata muscle. The fascia was then incised in line with the  incision at the junction of the anterior third and posterior 2/3rd. The  muscle was teased off the fascia and then the interval between the TFL  and the rectus was developed. The Hohmann retractor was then placed at  the top of the femoral neck over the capsule. The vessels overlying the  capsule were cauterized and the fat on top of the capsule was removed.  A Hohmann retractor was then placed anterior underneath the rectus  femoris to give exposure to the entire anterior capsule. A T-shaped   capsulotomy was performed. The edges were tagged and the femoral head  was identified.       Osteophytes are removed off the superior acetabulum.  The femoral neck was then cut in situ with an oscillating saw. Traction  was then applied to the left lower extremity utilizing the [facility]  traction. The femoral head was then removed. Retractors were placed  around the acetabulum and then circumferential removal of the labrum was  performed. Osteophytes were also removed. Reaming starts at 45 mm to  medialize and  Increased in 2 mm increments to 47 mm. We reamed in  approximately 40 degrees of abduction, 20 degrees anteversion. A 48 mm  pinnacle acetabular shell was then impacted in anatomic position under  fluoroscopic guidance with excellent purchase. We did not need to place  any additional dome screws. A 28 MM neutral + 4 marathon liner was then  placed into the acetabular shell.       The femoral lift was then placed along the lateral aspect of the femur  just distal to the vastus ridge. The leg was  externally rotated and capsule  was stripped off the inferior aspect of the femoral neck down to the  level of the lesser trochanter, this was done with electrocautery. The femur was lifted after this was performed. The  leg was then placed in an extended and adducted position essentially delivering the femur. We also removed the capsule superiorly and the piriformis from the piriformis  fossa to gain excellent exposure of the  proximal femur. Rongeur was used to remove some cancellous bone to get  into the lateral portion of the proximal femur for placement of the  initial starter reamer. The starter broaches was placed  the starter broach  and was shown to go down the center of the canal. Broaching  with the Actis system was then performed starting at size 0  coursing  Up to size 6. A size 6 had excellent torsional and rotational  and axial stability. The trial high offset neck was then placed   with a 28 + 5 trial head. The hip was then reduced. We confirmed that  the stem was in the canal both on AP and lateral x-rays. It also has excellent sizing. The hip was reduced with outstanding stability through full extension and full external rotation.. AP pelvis was taken and the leg lengths were measured and found to be equal. Hip was then dislocated again and the femoral head and neck removed. The  femoral broach was removed. Size 6 Actis stem with a high offset  neck was then impacted into the femur following native anteversion. Has  excellent purchase in the canal. Excellent torsional and rotational and  axial stability. It is confirmed to be in the canal on AP and lateral  fluoroscopic views. The 28 + 5 ceramic  head was placed and the hip  reduced with outstanding stability. Again AP pelvis was taken and it  confirmed that the leg lengths were equal. The wound was then copiously  irrigated with saline solution and the capsule reattached and repaired  with Ethibond suture. 30 ml of .25% Bupivicaine was  injected into the capsule and into the edge of the tensor fascia lata as well as subcutaneous tissue. The fascia overlying the tensor fascia lata was then closed with a running #1 V-Loc. Subcu was closed with interrupted 2-0 Vicryl and subcuticular running 4-0 Monocryl. Incision was cleaned  and dried. Steri-Strips and a bulky sterile dressing applied. Hemovac  drain was hooked to suction and then the patient was awakened and transported to  recovery in stable condition.        Please note that a surgical assistant was a medical necessity for this procedure to perform it in a safe and expeditious manner. Assistant was necessary to provide appropriate retraction of vital neurovascular structures and to prevent femoral fracture and allow for anatomic placement of the prosthesis.  Gaynelle Arabian, M.D.

## 2020-05-24 NOTE — Discharge Instructions (Addendum)
Information on my medicine - XARELTO (Rivaroxaban)  This medication education was reviewed with me or my healthcare representative as part of my discharge preparation.  Why was Xarelto prescribed for you? Xarelto was prescribed for you to reduce the risk of blood clots forming after orthopedic surgery. The medical term for these abnormal blood clots is venous thromboembolism (VTE).  What do you need to know about xarelto ? Take your Xarelto ONCE DAILY at the same time every day. You may take it either with or without food.  If you have difficulty swallowing the tablet whole, you may crush it and mix in applesauce just prior to taking your dose.  Take Xarelto exactly as prescribed by your doctor and DO NOT stop taking Xarelto without talking to the doctor who prescribed the medication.  Stopping without other VTE prevention medication to take the place of Xarelto may increase your risk of developing a clot.  After discharge, you should have regular check-up appointments with your healthcare provider that is prescribing your Xarelto.    What do you do if you miss a dose? If you miss a dose, take it as soon as you remember on the same day then continue your regularly scheduled once daily regimen the next day. Do not take two doses of Xarelto on the same day.   Important Safety Information A possible side effect of Xarelto is bleeding. You should call your healthcare provider right away if you experience any of the following: ? Bleeding from an injury or your nose that does not stop. ? Unusual colored urine (red or dark brown) or unusual colored stools (red or black). ? Unusual bruising for unknown reasons. ? A serious fall or if you hit your head (even if there is no bleeding).  Some medicines may interact with Xarelto and might increase your risk of bleeding while on Xarelto. To help avoid this, consult your healthcare provider or pharmacist prior to using any new prescription or  non-prescription medications, including herbals, vitamins, non-steroidal anti-inflammatory drugs (NSAIDs) and supplements.  This website has more information on Xarelto: [email or web address]   Gaynelle Arabian, MD Total Joint Specialist [facility] [place] [address], [address] [place], [place] [address] [phone]  ANTERIOR APPROACH TOTAL HIP REPLACEMENT POSTOPERATIVE DIRECTIONS     Hip Rehabilitation, Guidelines Following Surgery  The results of a hip operation are greatly improved after range of motion and muscle strengthening exercises. Follow all safety measures which are given to protect your hip. If any of these exercises cause increased pain or swelling in your joint, decrease the amount until you are comfortable again. Then slowly increase the exercises. Call your caregiver if you have problems or questions.   BLOOD CLOT PREVENTION . Take a 10 mg Xarelto once a day for three weeks following surgery. Then take an 81 mg Aspirin once a day for three weeks. Then discontinue Aspirin. . You may resume your vitamins/supplements once you have discontinued the Xarelto. . Do not take any NSAIDs (Advil, Aleve, Ibuprofen, Meloxicam, etc.) until you have discontinued the Xarelto.   HOME CARE INSTRUCTIONS  . Remove items at home which could result in a fall. This includes throw rugs or furniture in walking pathways.   ICE to the affected hip as frequently as 20-30 minutes an hour and then as needed for pain and swelling. Continue to use ice on the hip for pain and swelling from surgery. You may notice swelling that will progress down to the foot and ankle. This is normal  after surgery. Elevate the leg when you are not up walking on it.    Continue to use the breathing machine which will help keep your temperature down.  It is common for your temperature to cycle up and down following surgery, especially at night when you are not up moving around and exerting yourself.  The breathing  machine keeps your lungs expanded and your temperature down.  DIET You may resume your previous home diet once your are discharged from the hospital.  DRESSING / WOUND CARE / SHOWERING . You have an adhesive waterproof bandage over the incision. Leave this in place until your first follow-up appointment. Once you remove this you will not need to place another bandage.  . You may begin showering 3 days following surgery, but do not submerge the incision under water.  ACTIVITY . For the first 3-5 days, it is important to rest and keep the operative leg elevated. You should, as a general rule, rest for 50 minutes and walk/stretch for 10 minutes per hour. After 5 days, you may slowly increase activity as tolerated.  Marland Kitchen Perform the exercises you were provided twice a day for about 15-20 minutes each session. Begin these 2 days following surgery. . Walk with your walker as instructed. Use the walker until you are comfortable transitioning to a cane. Walk with the cane in the opposite hand of the operative leg. You may discontinue the cane once you are comfortable and walking steadily. . Avoid periods of inactivity such as sitting longer than an hour when not asleep. This helps prevent blood clots.  . Do not drive a car for 6 weeks or until released by your surgeon.  . Do not drive while taking narcotics.  TED HOSE STOCKINGS Wear the elastic stockings on both legs for three weeks following surgery during the day. You may remove them at night while sleeping.  WEIGHT BEARING Weight bearing as tolerated with assist device (walker, cane, etc) as directed, use it as long as suggested by your surgeon or therapist, typically at least 4-6 weeks.  POSTOPERATIVE CONSTIPATION PROTOCOL Constipation - defined medically as fewer than three stools per week and severe constipation as less than one stool per week.  One of the most common issues patients have following surgery is constipation.  Even if you have a  regular bowel pattern at home, your normal regimen is likely to be disrupted due to multiple reasons following surgery.  Combination of anesthesia, postoperative narcotics, change in appetite and fluid intake all can affect your bowels.  In order to avoid complications following surgery, here are some recommendations in order to help you during your recovery period.  . Colace (docusate) - Pick up an over-the-counter form of Colace or another stool softener and take twice a day as long as you are requiring postoperative pain medications.  Take with a full glass of water daily.  If you experience loose stools or diarrhea, hold the colace until you stool forms back up.  If your symptoms do not get better within 1 week or if they get worse, check with your doctor. . Dulcolax (bisacodyl) - Pick up over-the-counter and take as directed by the product packaging as needed to assist with the movement of your bowels.  Take with a full glass of water.  Use this product as needed if not relieved by Colace only.  . MiraLax (polyethylene glycol) - Pick up over-the-counter to have on hand.  MiraLax is a solution that will increase the amount  of water in your bowels to assist with bowel movements.  Take as directed and can mix with a glass of water, juice, soda, coffee, or tea.  Take if you go more than two days without a movement.Do not use MiraLax more than once per day. Call your doctor if you are still constipated or irregular after using this medication for 7 days in a row.  If you continue to have problems with postoperative constipation, please contact the office for further assistance and recommendations.  If you experience "the worst abdominal pain ever" or develop nausea or vomiting, please contact the office immediatly for further recommendations for treatment.  ITCHING  If you experience itching with your medications, try taking only a single pain pill, or even half a pain pill at a time.  You can also use  Benadryl over the counter for itching or also to help with sleep.   MEDICATIONS See your medication summary on the "After Visit Summary" that the nursing staff will review with you prior to discharge.  You may have some home medications which will be placed on hold until you complete the course of blood thinner medication.  It is important for you to complete the blood thinner medication as prescribed by your surgeon.  Continue your approved medications as instructed at time of discharge.  PRECAUTIONS If you experience chest pain or shortness of breath - call 911 immediately for transfer to the hospital emergency department.  If you develop a fever greater that 101 F, purulent drainage from wound, increased redness or drainage from wound, foul odor from the wound/dressing, or calf pain - CONTACT YOUR SURGEON.                                                   FOLLOW-UP APPOINTMENTS Make sure you keep all of your appointments after your operation with your surgeon and caregivers. You should call the office at the above phone number and make an appointment for approximately two weeks after the date of your surgery or on the date instructed by your surgeon outlined in the "After Visit Summary".  RANGE OF MOTION AND STRENGTHENING EXERCISES  These exercises are designed to help you keep full movement of your hip joint. Follow your caregiver's or physical therapist's instructions. Perform all exercises about fifteen times, three times per day or as directed. Exercise both hips, even if you have had only one joint replacement. These exercises can be done on a training (exercise) mat, on the floor, on a table or on a bed. Use whatever works the best and is most comfortable for you. Use music or television while you are exercising so that the exercises are a pleasant break in your day. This will make your life better with the exercises acting as a break in routine you can look forward to.  . Lying on your back,  slowly slide your foot toward your buttocks, raising your knee up off the floor. Then slowly slide your foot back down until your leg is straight again.  . Lying on your back spread your legs as far apart as you can without causing discomfort.  . Lying on your side, raise your upper leg and foot straight up from the floor as far as is comfortable. Slowly lower the leg and repeat.  . Lying on your back, tighten up the  muscle in the front of your thigh (quadriceps muscles). You can do this by keeping your leg straight and trying to raise your heel off the floor. This helps strengthen the largest muscle supporting your knee.  . Lying on your back, tighten up the muscles of your buttocks both with the legs straight and with the knee bent at a comfortable angle while keeping your heel on the floor.   IF YOU ARE TRANSFERRED TO A SKILLED REHAB FACILITY If the patient is transferred to a skilled rehab facility following release from the hospital, a list of the current medications will be sent to the facility for the patient to continue.  When discharged from the skilled rehab facility, please have the facility set up the patient's [place] prior to being released. Also, the skilled facility will be responsible for providing the patient with their medications at time of release from the facility to include their pain medication, the muscle relaxants, and their blood thinner medication. If the patient is still at the rehab facility at time of the two week follow up appointment, the skilled rehab facility will also need to assist the patient in arranging follow up appointment in our office and any transportation needs.  MAKE SURE YOU:  . Understand these instructions.  . Get help right away if you are not doing well or get worse.    DENTAL ANTIBIOTICS:  In most cases prophylactic antibiotics for Dental procdeures after total joint surgery are not necessary.  Exceptions are as  follows:  1. History of prior total joint infection  2. Severely immunocompromised (Organ Transplant, cancer chemotherapy, Rheumatoid biologic meds such as [place])  3. Poorly controlled diabetes (A1C &gt; 8.0, blood glucose over 200)  If you have one of these conditions, contact your surgeon for an antibiotic prescription, prior to your dental procedure.    Pick up stool softner and laxative for home use following surgery while on pain medications. Do not submerge incision under water. Please use good hand washing techniques while changing dressing each day. May shower starting three days after surgery. Please use a clean towel to pat the incision dry following showers. Continue to use ice for pain and swelling after surgery. Do not use any lotions or creams on the incision until instructed by your surgeon.

## 2020-05-24 NOTE — Anesthesia Postprocedure Evaluation (Signed)
Anesthesia Post Note  Patient: [name]  Procedure(s) Performed: TOTAL HIP ARTHROPLASTY ANTERIOR APPROACH (Right Hip)     Patient location during evaluation: PACU Anesthesia Type: General Level of consciousness: awake and alert Pain management: pain level controlled Vital Signs Assessment: post-procedure vital signs reviewed and stable Respiratory status: spontaneous breathing, nonlabored ventilation, respiratory function stable and patient connected to nasal cannula oxygen Cardiovascular status: blood pressure returned to baseline and stable Postop Assessment: no apparent nausea or vomiting Anesthetic complications: no    Last Vitals:  Vitals:   05/24/20 1158 05/24/20 1255  BP: (!) 167/80 (!) 146/61  Pulse: 77 81  Resp: 16 19  Temp: 37.6 C 37.3 C  SpO2: 97% 98%    Last Pain:  Vitals:   05/24/20 1255  TempSrc: Oral  PainSc:                  Tiajuana Amass

## 2020-05-25 ENCOUNTER — Encounter: Payer: Self-pay | Admitting: *Deleted

## 2020-05-25 DIAGNOSIS — R42 Dizziness and giddiness: Secondary | ICD-10-CM | POA: Diagnosis not present

## 2020-05-25 DIAGNOSIS — K279 Peptic ulcer, site unspecified, unspecified as acute or chronic, without hemorrhage or perforation: Secondary | ICD-10-CM | POA: Diagnosis not present

## 2020-05-25 DIAGNOSIS — G709 Myoneural disorder, unspecified: Secondary | ICD-10-CM | POA: Diagnosis not present

## 2020-05-25 DIAGNOSIS — M1611 Unilateral primary osteoarthritis, right hip: Secondary | ICD-10-CM | POA: Diagnosis not present

## 2020-05-25 DIAGNOSIS — M1990 Unspecified osteoarthritis, unspecified site: Secondary | ICD-10-CM | POA: Diagnosis not present

## 2020-05-25 DIAGNOSIS — G4733 Obstructive sleep apnea (adult) (pediatric): Secondary | ICD-10-CM | POA: Diagnosis not present

## 2020-05-25 DIAGNOSIS — K219 Gastro-esophageal reflux disease without esophagitis: Secondary | ICD-10-CM | POA: Diagnosis not present

## 2020-05-25 DIAGNOSIS — Z6839 Body mass index (BMI) 39.0-39.9, adult: Secondary | ICD-10-CM | POA: Diagnosis not present

## 2020-05-25 DIAGNOSIS — I10 Essential (primary) hypertension: Secondary | ICD-10-CM | POA: Diagnosis not present

## 2020-05-25 DIAGNOSIS — H353130 Nonexudative age-related macular degeneration, bilateral, stage unspecified: Secondary | ICD-10-CM | POA: Diagnosis not present

## 2020-05-25 DIAGNOSIS — M797 Fibromyalgia: Secondary | ICD-10-CM | POA: Diagnosis not present

## 2020-05-25 DIAGNOSIS — I4891 Unspecified atrial fibrillation: Secondary | ICD-10-CM | POA: Diagnosis not present

## 2020-05-25 DIAGNOSIS — E785 Hyperlipidemia, unspecified: Secondary | ICD-10-CM | POA: Diagnosis not present

## 2020-05-25 DIAGNOSIS — Z9049 Acquired absence of other specified parts of digestive tract: Secondary | ICD-10-CM | POA: Diagnosis not present

## 2020-05-25 DIAGNOSIS — Z86010 Personal history of colonic polyps: Secondary | ICD-10-CM | POA: Diagnosis not present

## 2020-05-25 LAB — CBC
HCT: 37.8 % (ref 36.0–46.0)
Hemoglobin: 11.9 g/dL — ABNORMAL LOW (ref 12.0–15.0)
MCH: 31.3 pg (ref 26.0–34.0)
MCHC: 31.5 g/dL (ref 30.0–36.0)
MCV: 99.5 fL (ref 80.0–100.0)
Platelets: 265 10*3/uL (ref 150–400)
RBC: 3.8 MIL/uL — ABNORMAL LOW (ref 3.87–5.11)
RDW: 13.2 % (ref 11.5–15.5)
WBC: 10.6 10*3/uL — ABNORMAL HIGH (ref 4.0–10.5)
nRBC: 0 % (ref 0.0–0.2)

## 2020-05-25 LAB — BASIC METABOLIC PANEL
Anion gap: 10 (ref 5–15)
BUN: 14 mg/dL (ref 8–23)
CO2: 28 mmol/L (ref 22–32)
Calcium: 8.3 mg/dL — ABNORMAL LOW (ref 8.9–10.3)
Chloride: 105 mmol/L (ref 98–111)
Creatinine, Ser: 0.68 mg/dL (ref 0.44–1.00)
GFR calc Af Amer: >60 mL/min (ref >60)
GFR calc non Af Amer: >60 mL/min (ref >60)
Glucose, Bld: 154 mg/dL — ABNORMAL HIGH (ref 70–99)
Potassium: 4.3 mmol/L (ref 3.5–5.1)
Sodium: 143 mmol/L (ref 135–145)

## 2020-05-25 MED ORDER — HYDROCODONE-ACETAMINOPHEN 5-325 MG PO TABS: 1.0000 | ORAL_TABLET | Freq: Four times a day (QID) | ORAL | 0 refills | Status: AC | PRN

## 2020-05-25 MED ORDER — METHOCARBAMOL 500 MG PO TABS: 500.0000 mg | ORAL_TABLET | Freq: Four times a day (QID) | ORAL | 0 refills | Status: AC | PRN

## 2020-05-25 MED ORDER — RIVAROXABAN 10 MG PO TABS: 10.0000 mg | ORAL_TABLET | Freq: Every day | ORAL | 0 refills | Status: AC

## 2020-05-25 NOTE — Progress Notes (Signed)
Physical Therapy Treatment Patient Details Name: [name] MRN: [id number] DOB: Apr 23, 1951 Today's Date: 05/25/2020    History of Present Illness Patient is 69 y.o. female s/p Rt THA anterior approach on 05/24/20 with PMH significant for vertigo, obesity, HTN, HLD, HOH, GERD, fibromyalgia, A-fib, anxiety, Rt TKA in 2012.    PT Comments    Pt motivated but reports increased pain this am.  Pt initiated HEP and ambulated short distance to sit up in recliner for bfast.   Follow Up Recommendations  Follow surgeon's recommendation for DC plan and follow-up therapies     Equipment Recommendations  None recommended by PT    Recommendations for Other Services       Precautions / Restrictions Precautions Precautions: Fall Restrictions Weight Bearing Restrictions: No Other Position/Activity Restrictions: WBAT    Mobility  Bed Mobility Overal bed mobility: Needs Assistance Bed Mobility: Supine to Sit     Supine to sit: HOB elevated;Min assist     General bed mobility comments: cues for sequence and use of L LE to self assist  Transfers Overall transfer level: Needs assistance Equipment used: Rolling walker (2 wheeled) Transfers: Sit to/from Stand Sit to Stand: Min guard         General transfer comment: cues for LE management and use of UEs to self assist  Ambulation/Gait Ambulation/Gait assistance: Min assist;Min guard Gait Distance (Feet): 22 Feet Assistive device: Rolling walker (2 wheeled) Gait Pattern/deviations: Step-to pattern;Decreased stride length;Trunk flexed Gait velocity: decreased       Stairs             Wheelchair Mobility    Modified Rankin (Stroke Patients Only)       Balance Overall balance assessment: Needs assistance Sitting-balance support: Feet supported Sitting balance-Leahy Scale: Good     Standing balance support: During functional activity;Bilateral upper extremity supported Standing balance-Leahy Scale: Fair                              Cognition Arousal/Alertness: Awake/alert Behavior During Therapy: WFL for tasks assessed/performed Overall Cognitive Status: Within Functional Limits for tasks assessed                                        Exercises Total Joint Exercises Ankle Circles/Pumps: AROM;Both;20 reps;Supine Quad Sets: AROM;Right;Seated;10 reps Heel Slides: AROM;Right;Seated;20 reps Hip ABduction/ADduction: AAROM;Right;15 reps;Supine    General Comments        Pertinent Vitals/Pain Pain Assessment: 0-10 Pain Score: 5  Pain Location: Rt hip Pain Descriptors / Indicators: Aching Pain Intervention(s): Limited activity within patient's tolerance;Monitored during session;Premedicated before session;Ice applied    Home Living                      Prior Function            PT Goals (current goals can now be found in the care plan section) Acute Rehab PT Goals Patient Stated Goal: to be able to get her Rt leg better and walk more PT Goal Formulation: With patient Time For Goal Achievement: 05/31/20 Potential to Achieve Goals: Good Progress towards PT goals: Progressing toward goals    Frequency    7X/week      PT Plan Current plan remains appropriate    Co-evaluation              AM-PAC  PT "6 Clicks" Mobility   Outcome Measure  Help needed turning from your back to your side while in a flat bed without using bedrails?: A Little Help needed moving from lying on your back to sitting on the side of a flat bed without using bedrails?: A Little Help needed moving to and from a bed to a chair (including a wheelchair)?: A Little Help needed standing up from a chair using your arms (e.g., wheelchair or bedside chair)?: A Little Help needed to walk in hospital room?: A Little Help needed climbing 3-5 steps with a railing? : A Little 6 Click Score: 18    End of Session Equipment Utilized During Treatment: Gait belt Activity  Tolerance: Patient tolerated treatment well Patient left: in chair;with call bell/phone within reach;with chair alarm set Nurse Communication: Mobility status PT Visit Diagnosis: Muscle weakness (generalized) (M62.81);Difficulty in walking, not elsewhere classified (R26.2)     Time: [id number] PT Time Calculation (min) (ACUTE ONLY): 28 min  Charges:  $Gait Training: 8-22 mins $Therapeutic Exercise: 8-22 mins                     [place] Pager [phone] Office [phone]    [facility] 05/25/2020, 12:38 PM

## 2020-05-25 NOTE — Progress Notes (Signed)
Subjective: 1 Day Post-Op Procedure(s) (LRB): TOTAL HIP ARTHROPLASTY ANTERIOR APPROACH (Right) Patient reports pain as mild.   Patient seen in rounds with Dr. Wynelle Link. Patient is well, and has had no acute complaints or problems other than pain in her right hip. No acute events overnight. She ambulated 80 feet with PT yesterday. She denies CP, SHOB, N/V. Voiding without difficulty.  We will continue therapy today.   Objective: Vital signs in last 24 hours: Temp:  [97.9 F (36.6 C)-99.7 F (37.6 C)] 98.1 F (36.7 C) (06/03 0549) Pulse Rate:  [72-91] 72 (06/03 0549) Resp:  [11-20] 14 (06/03 0549) BP: (134-169)/(61-125) 147/82 (06/03 0549) SpO2:  [93 %-100 %] 96 % (06/03 0549) Weight:  [99.8 kg] 99.8 kg (06/02 1158)  Intake/Output from previous day:  Intake/Output Summary (Last 24 hours) at 05/25/2020 0938 Last data filed at 05/25/2020 0905 Gross per 24 hour  Intake 3349.06 ml  Output 2850 ml  Net 499.06 ml     Intake/Output this shift: Total I/O In: 240 [P.O.:240] Out: 100 [Urine:100]  Labs: Recent Labs    05/25/20 0246  HGB 11.9*   Recent Labs    05/25/20 0246  WBC 10.6*  RBC 3.80*  HCT 37.8  PLT 265   Recent Labs    05/25/20 0246  NA 143  K 4.3  CL 105  CO2 28  BUN 14  CREATININE 0.68  GLUCOSE 154*  CALCIUM 8.3*   No results for input(s): LABPT, INR in the last 72 hours.  Exam: General - Patient is Alert and Oriented Extremity - Neurologically intact Sensation intact distally Intact pulses distally Dorsiflexion/Plantar flexion intact Dressing - dressing C/D/I Motor Function - intact, moving foot and toes well on exam.   Past Medical History:  Diagnosis Date  . Anxiety disorder   . Atrial fibrillation (HCC)    occ flutter but no issues with this now, off coumadin about 1 1/2 years now   . Blood transfusion without reported diagnosis   . Cataract    bilaterally removed twice   . Degenerative joint disease    Lumbosacral spine; status post  right TKA  . Diverticulosis   . Fibromyalgia   . Gastroparesis   . GERD (gastroesophageal reflux disease)   . Hearing loss    bilateral hearing aides  . Hyperlipidemia   . Hyperplastic colon polyp   . Hypertension   . Macular degeneration, dry    both eyes  . Obesity   . OSA on CPAP   . PUD (peptic ulcer disease)   . Urinary incontinence    wears pad  . Varicose vein of leg   . Vertigo    Right ear does not functions cause imbalance    Assessment/Plan: 1 Day Post-Op Procedure(s) (LRB): TOTAL HIP ARTHROPLASTY ANTERIOR APPROACH (Right) Principal Problem:   OA (osteoarthritis) of hip Active Problems:   Primary osteoarthritis of right hip  Estimated body mass index is 39.6 kg/m as calculated from the following:   Height as of this encounter: 5' 2.5" (1.588 m).   Weight as of this encounter: 99.8 kg. Advance diet Up with therapy D/C IV fluids  DVT Prophylaxis - Xarelto Weight bearing as tolerated.  Hemoglobin stable at 11.9 today. Plan is to go Home after hospital stay. Plan for discharge today following 1-2 sessions of therapy as long as she continues to meet her goals. She will follow up in the office in 2 weeks. Aquacel to remain in place.   Griffith Citron, PA-C Orthopedic  Surgery [phone] 05/25/2020, 9:38 AM

## 2020-05-25 NOTE — Progress Notes (Signed)
Physical Therapy Treatment Patient Details Name: [name] MRN: [id number] DOB: 25-Dec-1950 Today's Date: 05/25/2020    History of Present Illness Patient is 69 y.o. female s/p Rt THA anterior approach on 05/24/20 with PMH significant for vertigo, obesity, HTN, HLD, HOH, GERD, fibromyalgia, A-fib, anxiety, Rt TKA in 2012.    PT Comments    Pt up to ambulate increased distance in hall with pt reported improved pain control. Will follow up with family ed following arrival of spouse.  Follow Up Recommendations  Follow surgeon's recommendation for DC plan and follow-up therapies     Equipment Recommendations  None recommended by PT    Recommendations for Other Services       Precautions / Restrictions Precautions Precautions: Fall Restrictions Weight Bearing Restrictions: No Other Position/Activity Restrictions: WBAT    Mobility  Bed Mobility Overal bed mobility: Needs Assistance Bed Mobility: Sit to Supine     Supine to sit: HOB elevated;Min assist Sit to supine: Min guard   General bed mobility comments: cues for sequence and use of L LE to self assist; pt utilizing gait belt to assist R LE onto bed  Transfers Overall transfer level: Needs assistance Equipment used: Rolling walker (2 wheeled) Transfers: Sit to/from Stand Sit to Stand: Min guard         General transfer comment: cues for LE management and use of UEs to self assist  Ambulation/Gait Ambulation/Gait assistance: Min assist;Min guard Gait Distance (Feet): 100 Feet Assistive device: Rolling walker (2 wheeled) Gait Pattern/deviations: Step-to pattern;Decreased stride length;Trunk flexed Gait velocity: decreased   General Gait Details: cues for posture, position from RW and intial sequence`   Stairs             Wheelchair Mobility    Modified Rankin (Stroke Patients Only)       Balance Overall balance assessment: Needs assistance Sitting-balance support: Feet supported Sitting  balance-Leahy Scale: Good     Standing balance support: During functional activity;Bilateral upper extremity supported Standing balance-Leahy Scale: Fair                              Cognition Arousal/Alertness: Awake/alert Behavior During Therapy: WFL for tasks assessed/performed Overall Cognitive Status: Within Functional Limits for tasks assessed                                        Exercises Total Joint Exercises Ankle Circles/Pumps: AROM;Both;20 reps;Supine Quad Sets: AROM;Right;Seated;10 reps Heel Slides: AROM;Right;Seated;20 reps Hip ABduction/ADduction: AAROM;Right;15 reps;Supine    General Comments        Pertinent Vitals/Pain Pain Assessment: 0-10 Pain Score: 3  Pain Location: Rt hip Pain Descriptors / Indicators: Burning Pain Intervention(s): Limited activity within patient's tolerance;Monitored during session;Premedicated before session;Ice applied    Home Living                      Prior Function            PT Goals (current goals can now be found in the care plan section) Acute Rehab PT Goals Patient Stated Goal: to be able to get her Rt leg better and walk more PT Goal Formulation: With patient Time For Goal Achievement: 05/31/20 Potential to Achieve Goals: Good Progress towards PT goals: Progressing toward goals    Frequency    7X/week  PT Plan Current plan remains appropriate    Co-evaluation              AM-PAC PT "6 Clicks" Mobility   Outcome Measure  Help needed turning from your back to your side while in a flat bed without using bedrails?: A Little Help needed moving from lying on your back to sitting on the side of a flat bed without using bedrails?: A Little Help needed moving to and from a bed to a chair (including a wheelchair)?: A Little Help needed standing up from a chair using your arms (e.g., wheelchair or bedside chair)?: A Little Help needed to walk in hospital room?: A  Little Help needed climbing 3-5 steps with a railing? : A Little 6 Click Score: 18    End of Session Equipment Utilized During Treatment: Gait belt Activity Tolerance: Patient tolerated treatment well Patient left: with call bell/phone within reach;in bed;with bed alarm set Nurse Communication: Mobility status PT Visit Diagnosis: Muscle weakness (generalized) (M62.81);Difficulty in walking, not elsewhere classified (R26.2)     Time: [id number] PT Time Calculation (min) (ACUTE ONLY): 21 min  Charges:  $Gait Training: 8-22 mins $Therapeutic Exercise: 8-22 mins                     [place] Pager [phone] Office [phone]    Anaalicia Reimann 05/25/2020, 12:42 PM

## 2020-05-25 NOTE — Progress Notes (Signed)
Physical Therapy Treatment Patient Details Name: [name] MRN: [id number] DOB: 07/30/1951 Today's Date: 05/25/2020    History of Present Illness Patient is 69 y.o. female s/p Rt THA anterior approach on 05/24/20 with PMH significant for vertigo, obesity, HTN, HLD, HOH, GERD, fibromyalgia, A-fib, anxiety, Rt TKA in 2012.    PT Comments    Pt continues motivated and eager for dc home.  Spouse present for family ed including car transfers, stairs and HEP - written instruction provided and reviewed.     Follow Up Recommendations  Follow surgeon's recommendation for DC plan and follow-up therapies     Equipment Recommendations  None recommended by PT    Recommendations for Other Services       Precautions / Restrictions Precautions Precautions: Fall Restrictions Weight Bearing Restrictions: No Other Position/Activity Restrictions: WBAT    Mobility  Bed Mobility Overal bed mobility: Needs Assistance Bed Mobility: Supine to Sit;Sit to Supine     Supine to sit: Min guard;Supervision Sit to supine: Min guard;Supervision   General bed mobility comments: cues for sequence and use of L LE to self assist; pt utilizing gait belt to assist R LE   Transfers Overall transfer level: Needs assistance Equipment used: Rolling walker (2 wheeled) Transfers: Sit to/from Stand Sit to Stand: Min guard;Supervision         General transfer comment: cues for LE management and use of UEs to self assist  Ambulation/Gait Ambulation/Gait assistance: Min guard;Supervision Gait Distance (Feet): 100 Feet Assistive device: Rolling walker (2 wheeled) Gait Pattern/deviations: Step-to pattern;Decreased stride length;Trunk flexed Gait velocity: decreased   General Gait Details: cues for posture, position from RW and intial sequence`   Stairs Stairs: Yes Stairs assistance: Min assist Stair Management: One rail Right;Step to pattern;Forwards;With cane Number of Stairs: 4 General stair  comments: 2 stairs twice with spouse assisting on second attempt;  cues for sequence and foot/cane placement   Wheelchair Mobility    Modified Rankin (Stroke Patients Only)       Balance Overall balance assessment: Needs assistance Sitting-balance support: Feet supported Sitting balance-Leahy Scale: Good     Standing balance support: During functional activity;Bilateral upper extremity supported Standing balance-Leahy Scale: Fair                              Cognition Arousal/Alertness: Awake/alert Behavior During Therapy: WFL for tasks assessed/performed Overall Cognitive Status: Within Functional Limits for tasks assessed                                        Exercises Total Joint Exercises Ankle Circles/Pumps: AROM;Both;20 reps;Supine Quad Sets: AROM;Right;Seated;10 reps Heel Slides: AROM;Right;Seated;20 reps Hip ABduction/ADduction: AAROM;Right;15 reps;Supine Long Arc Quad: AROM;Right;10 reps;Seated    General Comments        Pertinent Vitals/Pain Pain Assessment: 0-10 Pain Score: 4  Pain Location: Rt hip Pain Descriptors / Indicators: Burning Pain Intervention(s): Limited activity within patient's tolerance;Monitored during session;Premedicated before session;Ice applied    Home Living                      Prior Function            PT Goals (current goals can now be found in the care plan section) Acute Rehab PT Goals Patient Stated Goal: to be able to get her Rt leg better and walk  more PT Goal Formulation: With patient Time For Goal Achievement: 05/31/20 Potential to Achieve Goals: Good Progress towards PT goals: Progressing toward goals    Frequency    7X/week      PT Plan Current plan remains appropriate    Co-evaluation              AM-PAC PT "6 Clicks" Mobility   Outcome Measure  Help needed turning from your back to your side while in a flat bed without using bedrails?: A Little Help  needed moving from lying on your back to sitting on the side of a flat bed without using bedrails?: A Little Help needed moving to and from a bed to a chair (including a wheelchair)?: A Little Help needed standing up from a chair using your arms (e.g., wheelchair or bedside chair)?: A Little Help needed to walk in hospital room?: A Little Help needed climbing 3-5 steps with a railing? : A Little 6 Click Score: 18    End of Session Equipment Utilized During Treatment: Gait belt Activity Tolerance: Patient tolerated treatment well Patient left: with call bell/phone within reach;in bed;with bed alarm set Nurse Communication: Mobility status PT Visit Diagnosis: Muscle weakness (generalized) (M62.81);Difficulty in walking, not elsewhere classified (R26.2)     Time: [id number] PT Time Calculation (min) (ACUTE ONLY): 41 min  Charges:  $Gait Training: 8-22 mins $Therapeutic Exercise: 8-22 mins $Therapeutic Activity: 8-22 mins                     Debe Coder PT Acute Rehabilitation Services Pager [phone] Office [phone]    Randye Treichler 05/25/2020, 12:47 PM

## 2020-05-25 NOTE — TOC Transition Note (Signed)
Transition of Care [facility]) - CM/SW Discharge Note   Patient Details  Name: [name] MRN: [id number] Date of Birth: April 16, 1951  Transition of Care [facility]) CM/SW Contact:  Lia Hopping, [place] Phone Number: 05/25/2020, 11:10 AM   Clinical Narrative:    HEP CSW confirm the patient has a  RW and BSC.    Final next level of care: (HEP) Barriers to Discharge: No Barriers Identified   Patient Goals and CMS Choice     Choice offered to / list presented to : NA  Discharge Placement                       Discharge Plan and Services                DME Arranged: N/A DME Agency: NA         HH Agency: NA        Social Determinants of Health (SDOH) Interventions     Readmission Risk Interventions No flowsheet data found.

## 2020-06-20 DIAGNOSIS — Z6838 Body mass index (BMI) 38.0-38.9, adult: Secondary | ICD-10-CM | POA: Diagnosis not present

## 2020-06-20 DIAGNOSIS — G894 Chronic pain syndrome: Secondary | ICD-10-CM | POA: Diagnosis not present

## 2020-07-04 DIAGNOSIS — H35363 Drusen (degenerative) of macula, bilateral: Secondary | ICD-10-CM | POA: Diagnosis not present

## 2020-07-11 DIAGNOSIS — Z6838 Body mass index (BMI) 38.0-38.9, adult: Secondary | ICD-10-CM | POA: Diagnosis not present

## 2020-07-11 DIAGNOSIS — Z471 Aftercare following joint replacement surgery: Secondary | ICD-10-CM | POA: Diagnosis not present

## 2020-07-11 DIAGNOSIS — G894 Chronic pain syndrome: Secondary | ICD-10-CM | POA: Diagnosis not present

## 2020-07-11 DIAGNOSIS — Z96641 Presence of right artificial hip joint: Secondary | ICD-10-CM | POA: Diagnosis not present

## 2020-07-11 DIAGNOSIS — R7302 Impaired glucose tolerance (oral): Secondary | ICD-10-CM | POA: Diagnosis not present

## 2020-07-24 DIAGNOSIS — Z9849 Cataract extraction status, unspecified eye: Secondary | ICD-10-CM | POA: Diagnosis not present

## 2020-07-24 DIAGNOSIS — H3530 Unspecified macular degeneration: Secondary | ICD-10-CM | POA: Diagnosis not present

## 2020-07-24 DIAGNOSIS — H35363 Drusen (degenerative) of macula, bilateral: Secondary | ICD-10-CM | POA: Diagnosis not present

## 2020-07-24 DIAGNOSIS — Z961 Presence of intraocular lens: Secondary | ICD-10-CM | POA: Diagnosis not present

## 2020-08-23 DIAGNOSIS — E538 Deficiency of other specified B group vitamins: Secondary | ICD-10-CM | POA: Diagnosis not present

## 2020-08-23 DIAGNOSIS — G894 Chronic pain syndrome: Secondary | ICD-10-CM | POA: Diagnosis not present

## 2020-08-23 DIAGNOSIS — E559 Vitamin D deficiency, unspecified: Secondary | ICD-10-CM | POA: Diagnosis not present

## 2020-08-23 DIAGNOSIS — Z6838 Body mass index (BMI) 38.0-38.9, adult: Secondary | ICD-10-CM | POA: Diagnosis not present

## 2020-09-22 DIAGNOSIS — Z23 Encounter for immunization: Secondary | ICD-10-CM | POA: Diagnosis not present

## 2020-09-22 DIAGNOSIS — Z6838 Body mass index (BMI) 38.0-38.9, adult: Secondary | ICD-10-CM | POA: Diagnosis not present

## 2020-09-22 DIAGNOSIS — G894 Chronic pain syndrome: Secondary | ICD-10-CM | POA: Diagnosis not present

## 2020-10-25 DIAGNOSIS — Z6838 Body mass index (BMI) 38.0-38.9, adult: Secondary | ICD-10-CM | POA: Diagnosis not present

## 2020-10-25 DIAGNOSIS — G894 Chronic pain syndrome: Secondary | ICD-10-CM | POA: Diagnosis not present

## 2020-11-02 ENCOUNTER — Other Ambulatory Visit: Payer: Self-pay | Admitting: Internal Medicine

## 2020-11-02 ENCOUNTER — Other Ambulatory Visit: Payer: Self-pay | Admitting: Family Medicine

## 2020-11-22 DIAGNOSIS — Z6839 Body mass index (BMI) 39.0-39.9, adult: Secondary | ICD-10-CM | POA: Diagnosis not present

## 2020-11-22 DIAGNOSIS — G894 Chronic pain syndrome: Secondary | ICD-10-CM | POA: Diagnosis not present

## 2020-11-29 ENCOUNTER — Other Ambulatory Visit: Payer: Self-pay | Admitting: Family Medicine

## 2020-11-29 DIAGNOSIS — Z1231 Encounter for screening mammogram for malignant neoplasm of breast: Secondary | ICD-10-CM

## 2020-11-30 ENCOUNTER — Other Ambulatory Visit: Payer: Self-pay | Admitting: Family Medicine

## 2020-11-30 DIAGNOSIS — N6489 Other specified disorders of breast: Secondary | ICD-10-CM

## 2020-11-30 DIAGNOSIS — R928 Other abnormal and inconclusive findings on diagnostic imaging of breast: Secondary | ICD-10-CM

## 2020-12-01 DIAGNOSIS — G4730 Sleep apnea, unspecified: Secondary | ICD-10-CM | POA: Diagnosis not present

## 2020-12-01 DIAGNOSIS — G4733 Obstructive sleep apnea (adult) (pediatric): Secondary | ICD-10-CM | POA: Diagnosis not present

## 2020-12-05 DIAGNOSIS — Z6838 Body mass index (BMI) 38.0-38.9, adult: Secondary | ICD-10-CM | POA: Diagnosis not present

## 2020-12-05 DIAGNOSIS — Z1331 Encounter for screening for depression: Secondary | ICD-10-CM | POA: Diagnosis not present

## 2020-12-05 DIAGNOSIS — Z0000 Encounter for general adult medical examination without abnormal findings: Secondary | ICD-10-CM | POA: Diagnosis not present

## 2020-12-26 DIAGNOSIS — Z6839 Body mass index (BMI) 39.0-39.9, adult: Secondary | ICD-10-CM | POA: Diagnosis not present

## 2020-12-26 DIAGNOSIS — M542 Cervicalgia: Secondary | ICD-10-CM | POA: Diagnosis not present

## 2020-12-26 DIAGNOSIS — G894 Chronic pain syndrome: Secondary | ICD-10-CM | POA: Diagnosis not present

## 2021-01-26 DIAGNOSIS — Z6838 Body mass index (BMI) 38.0-38.9, adult: Secondary | ICD-10-CM | POA: Diagnosis not present

## 2021-01-26 DIAGNOSIS — G894 Chronic pain syndrome: Secondary | ICD-10-CM | POA: Diagnosis not present

## 2021-02-01 DIAGNOSIS — H35363 Drusen (degenerative) of macula, bilateral: Secondary | ICD-10-CM | POA: Diagnosis not present

## 2021-02-27 DIAGNOSIS — Z6839 Body mass index (BMI) 39.0-39.9, adult: Secondary | ICD-10-CM | POA: Diagnosis not present

## 2021-02-27 DIAGNOSIS — G894 Chronic pain syndrome: Secondary | ICD-10-CM | POA: Diagnosis not present

## 2021-03-28 DIAGNOSIS — G894 Chronic pain syndrome: Secondary | ICD-10-CM | POA: Diagnosis not present

## 2021-03-28 DIAGNOSIS — Z6839 Body mass index (BMI) 39.0-39.9, adult: Secondary | ICD-10-CM | POA: Diagnosis not present

## 2021-04-20 DIAGNOSIS — G894 Chronic pain syndrome: Secondary | ICD-10-CM | POA: Diagnosis not present

## 2021-04-20 DIAGNOSIS — Z6838 Body mass index (BMI) 38.0-38.9, adult: Secondary | ICD-10-CM | POA: Diagnosis not present

## 2021-05-23 ENCOUNTER — Other Ambulatory Visit (HOSPITAL_COMMUNITY): Payer: Self-pay | Admitting: Family Medicine

## 2021-05-23 DIAGNOSIS — G894 Chronic pain syndrome: Secondary | ICD-10-CM | POA: Diagnosis not present

## 2021-05-23 DIAGNOSIS — Z6838 Body mass index (BMI) 38.0-38.9, adult: Secondary | ICD-10-CM | POA: Diagnosis not present

## 2021-05-23 DIAGNOSIS — Z1231 Encounter for screening mammogram for malignant neoplasm of breast: Secondary | ICD-10-CM

## 2021-05-28 ENCOUNTER — Other Ambulatory Visit (HOSPITAL_COMMUNITY): Payer: Self-pay | Admitting: Family Medicine

## 2021-05-28 DIAGNOSIS — R928 Other abnormal and inconclusive findings on diagnostic imaging of breast: Secondary | ICD-10-CM

## 2021-06-14 ENCOUNTER — Ambulatory Visit (HOSPITAL_COMMUNITY)
Admission: RE | Admit: 2021-06-14 | Discharge: 2021-06-14 | Disposition: A | Discharge status: Home / Self Care | Payer: Medicare Other | Source: Ambulatory Visit | Attending: Family Medicine | Admitting: Family Medicine

## 2021-06-14 ENCOUNTER — Other Ambulatory Visit: Payer: Self-pay

## 2021-06-14 DIAGNOSIS — R922 Inconclusive mammogram: Secondary | ICD-10-CM | POA: Diagnosis not present

## 2021-06-14 DIAGNOSIS — R928 Other abnormal and inconclusive findings on diagnostic imaging of breast: Secondary | ICD-10-CM

## 2021-06-19 DIAGNOSIS — Z6838 Body mass index (BMI) 38.0-38.9, adult: Secondary | ICD-10-CM | POA: Diagnosis not present

## 2021-06-19 DIAGNOSIS — G4733 Obstructive sleep apnea (adult) (pediatric): Secondary | ICD-10-CM | POA: Diagnosis not present

## 2021-06-19 DIAGNOSIS — G894 Chronic pain syndrome: Secondary | ICD-10-CM | POA: Diagnosis not present

## 2021-07-10 ENCOUNTER — Other Ambulatory Visit (HOSPITAL_COMMUNITY): Payer: Medicare Other

## 2021-07-10 ENCOUNTER — Encounter (HOSPITAL_COMMUNITY): Payer: Medicare Other

## 2021-07-23 DIAGNOSIS — Z6838 Body mass index (BMI) 38.0-38.9, adult: Secondary | ICD-10-CM | POA: Diagnosis not present

## 2021-07-23 DIAGNOSIS — G894 Chronic pain syndrome: Secondary | ICD-10-CM | POA: Diagnosis not present

## 2021-08-23 DIAGNOSIS — R7309 Other abnormal glucose: Secondary | ICD-10-CM | POA: Diagnosis not present

## 2021-08-23 DIAGNOSIS — Z6838 Body mass index (BMI) 38.0-38.9, adult: Secondary | ICD-10-CM | POA: Diagnosis not present

## 2021-08-23 DIAGNOSIS — E559 Vitamin D deficiency, unspecified: Secondary | ICD-10-CM | POA: Diagnosis not present

## 2021-08-23 DIAGNOSIS — E785 Hyperlipidemia, unspecified: Secondary | ICD-10-CM | POA: Diagnosis not present

## 2021-08-23 DIAGNOSIS — I10 Essential (primary) hypertension: Secondary | ICD-10-CM | POA: Diagnosis not present

## 2021-08-23 DIAGNOSIS — G894 Chronic pain syndrome: Secondary | ICD-10-CM | POA: Diagnosis not present

## 2021-09-24 DIAGNOSIS — G894 Chronic pain syndrome: Secondary | ICD-10-CM | POA: Diagnosis not present

## 2021-09-24 DIAGNOSIS — Z23 Encounter for immunization: Secondary | ICD-10-CM | POA: Diagnosis not present

## 2021-10-18 DIAGNOSIS — H353131 Nonexudative age-related macular degeneration, bilateral, early dry stage: Secondary | ICD-10-CM | POA: Diagnosis not present

## 2021-10-19 DIAGNOSIS — H524 Presbyopia: Secondary | ICD-10-CM | POA: Diagnosis not present

## 2021-10-23 DIAGNOSIS — Z6834 Body mass index (BMI) 34.0-34.9, adult: Secondary | ICD-10-CM | POA: Diagnosis not present

## 2021-10-23 DIAGNOSIS — G894 Chronic pain syndrome: Secondary | ICD-10-CM | POA: Diagnosis not present

## 2021-10-25 DIAGNOSIS — G4733 Obstructive sleep apnea (adult) (pediatric): Secondary | ICD-10-CM | POA: Diagnosis not present

## 2021-11-07 DIAGNOSIS — U071 COVID-19: Secondary | ICD-10-CM | POA: Diagnosis not present

## 2021-11-26 DIAGNOSIS — E559 Vitamin D deficiency, unspecified: Secondary | ICD-10-CM | POA: Diagnosis not present

## 2021-11-26 DIAGNOSIS — G894 Chronic pain syndrome: Secondary | ICD-10-CM | POA: Diagnosis not present

## 2021-11-26 DIAGNOSIS — Z6834 Body mass index (BMI) 34.0-34.9, adult: Secondary | ICD-10-CM | POA: Diagnosis not present

## 2021-11-26 DIAGNOSIS — R7309 Other abnormal glucose: Secondary | ICD-10-CM | POA: Diagnosis not present

## 2021-12-25 DIAGNOSIS — Z6835 Body mass index (BMI) 35.0-35.9, adult: Secondary | ICD-10-CM | POA: Diagnosis not present

## 2021-12-25 DIAGNOSIS — G894 Chronic pain syndrome: Secondary | ICD-10-CM | POA: Diagnosis not present

## 2021-12-25 DIAGNOSIS — E669 Obesity, unspecified: Secondary | ICD-10-CM | POA: Diagnosis not present

## 2022-01-28 DIAGNOSIS — M13852 Other specified arthritis, left hip: Secondary | ICD-10-CM | POA: Diagnosis not present

## 2022-01-28 DIAGNOSIS — I10 Essential (primary) hypertension: Secondary | ICD-10-CM | POA: Diagnosis not present

## 2022-01-28 DIAGNOSIS — Z0000 Encounter for general adult medical examination without abnormal findings: Secondary | ICD-10-CM | POA: Diagnosis not present

## 2022-01-28 DIAGNOSIS — G894 Chronic pain syndrome: Secondary | ICD-10-CM | POA: Diagnosis not present

## 2022-02-21 DIAGNOSIS — M25562 Pain in left knee: Secondary | ICD-10-CM | POA: Diagnosis not present

## 2022-02-21 DIAGNOSIS — Z96641 Presence of right artificial hip joint: Secondary | ICD-10-CM | POA: Diagnosis not present

## 2022-02-25 DIAGNOSIS — G894 Chronic pain syndrome: Secondary | ICD-10-CM | POA: Diagnosis not present

## 2022-02-25 DIAGNOSIS — G4733 Obstructive sleep apnea (adult) (pediatric): Secondary | ICD-10-CM | POA: Diagnosis not present

## 2022-02-25 DIAGNOSIS — Z6835 Body mass index (BMI) 35.0-35.9, adult: Secondary | ICD-10-CM | POA: Diagnosis not present

## 2022-02-25 DIAGNOSIS — E6609 Other obesity due to excess calories: Secondary | ICD-10-CM | POA: Diagnosis not present

## 2022-04-01 DIAGNOSIS — E559 Vitamin D deficiency, unspecified: Secondary | ICD-10-CM | POA: Diagnosis not present

## 2022-04-01 DIAGNOSIS — G4733 Obstructive sleep apnea (adult) (pediatric): Secondary | ICD-10-CM | POA: Diagnosis not present

## 2022-04-01 DIAGNOSIS — E785 Hyperlipidemia, unspecified: Secondary | ICD-10-CM | POA: Diagnosis not present

## 2022-04-01 DIAGNOSIS — Z6835 Body mass index (BMI) 35.0-35.9, adult: Secondary | ICD-10-CM | POA: Diagnosis not present

## 2022-04-01 DIAGNOSIS — I10 Essential (primary) hypertension: Secondary | ICD-10-CM | POA: Diagnosis not present

## 2022-04-01 DIAGNOSIS — E6609 Other obesity due to excess calories: Secondary | ICD-10-CM | POA: Diagnosis not present

## 2022-04-30 DIAGNOSIS — E6609 Other obesity due to excess calories: Secondary | ICD-10-CM | POA: Diagnosis not present

## 2022-04-30 DIAGNOSIS — G894 Chronic pain syndrome: Secondary | ICD-10-CM | POA: Diagnosis not present

## 2022-04-30 DIAGNOSIS — Z6835 Body mass index (BMI) 35.0-35.9, adult: Secondary | ICD-10-CM | POA: Diagnosis not present

## 2022-05-07 DIAGNOSIS — E6609 Other obesity due to excess calories: Secondary | ICD-10-CM | POA: Diagnosis not present

## 2022-05-07 DIAGNOSIS — G894 Chronic pain syndrome: Secondary | ICD-10-CM | POA: Diagnosis not present

## 2022-05-07 DIAGNOSIS — Z6835 Body mass index (BMI) 35.0-35.9, adult: Secondary | ICD-10-CM | POA: Diagnosis not present

## 2022-06-06 DIAGNOSIS — E6609 Other obesity due to excess calories: Secondary | ICD-10-CM | POA: Diagnosis not present

## 2022-06-06 DIAGNOSIS — Z6834 Body mass index (BMI) 34.0-34.9, adult: Secondary | ICD-10-CM | POA: Diagnosis not present

## 2022-06-06 DIAGNOSIS — G894 Chronic pain syndrome: Secondary | ICD-10-CM | POA: Diagnosis not present

## 2022-06-06 DIAGNOSIS — F321 Major depressive disorder, single episode, moderate: Secondary | ICD-10-CM | POA: Diagnosis not present

## 2022-07-04 DIAGNOSIS — Z6835 Body mass index (BMI) 35.0-35.9, adult: Secondary | ICD-10-CM | POA: Diagnosis not present

## 2022-07-04 DIAGNOSIS — G894 Chronic pain syndrome: Secondary | ICD-10-CM | POA: Diagnosis not present

## 2022-07-04 DIAGNOSIS — E6609 Other obesity due to excess calories: Secondary | ICD-10-CM | POA: Diagnosis not present

## 2022-08-02 DIAGNOSIS — E6609 Other obesity due to excess calories: Secondary | ICD-10-CM | POA: Diagnosis not present

## 2022-08-02 DIAGNOSIS — G894 Chronic pain syndrome: Secondary | ICD-10-CM | POA: Diagnosis not present

## 2022-08-02 DIAGNOSIS — Z6834 Body mass index (BMI) 34.0-34.9, adult: Secondary | ICD-10-CM | POA: Diagnosis not present

## 2022-08-22 ENCOUNTER — Other Ambulatory Visit (HOSPITAL_COMMUNITY): Payer: Self-pay | Admitting: Family Medicine

## 2022-08-22 DIAGNOSIS — Z1231 Encounter for screening mammogram for malignant neoplasm of breast: Secondary | ICD-10-CM

## 2022-08-30 DIAGNOSIS — Z6833 Body mass index (BMI) 33.0-33.9, adult: Secondary | ICD-10-CM | POA: Diagnosis not present

## 2022-08-30 DIAGNOSIS — G894 Chronic pain syndrome: Secondary | ICD-10-CM | POA: Diagnosis not present

## 2022-09-02 DIAGNOSIS — I10 Essential (primary) hypertension: Secondary | ICD-10-CM | POA: Diagnosis not present

## 2022-09-02 DIAGNOSIS — G894 Chronic pain syndrome: Secondary | ICD-10-CM | POA: Diagnosis not present

## 2022-09-02 DIAGNOSIS — F419 Anxiety disorder, unspecified: Secondary | ICD-10-CM | POA: Diagnosis not present

## 2022-09-02 DIAGNOSIS — E559 Vitamin D deficiency, unspecified: Secondary | ICD-10-CM | POA: Diagnosis not present

## 2022-09-04 ENCOUNTER — Ambulatory Visit (HOSPITAL_COMMUNITY): Payer: Medicare Other

## 2022-09-27 DIAGNOSIS — Z6833 Body mass index (BMI) 33.0-33.9, adult: Secondary | ICD-10-CM | POA: Diagnosis not present

## 2022-09-27 DIAGNOSIS — G894 Chronic pain syndrome: Secondary | ICD-10-CM | POA: Diagnosis not present

## 2022-09-27 DIAGNOSIS — E6609 Other obesity due to excess calories: Secondary | ICD-10-CM | POA: Diagnosis not present

## 2022-10-16 ENCOUNTER — Ambulatory Visit (HOSPITAL_COMMUNITY)
Admission: RE | Admit: 2022-10-16 | Discharge: 2022-10-16 | Disposition: A | Discharge status: Home / Self Care | Payer: Medicare Other | Source: Ambulatory Visit | Attending: Family Medicine | Admitting: Family Medicine

## 2022-10-16 DIAGNOSIS — Z1231 Encounter for screening mammogram for malignant neoplasm of breast: Secondary | ICD-10-CM | POA: Insufficient documentation

## 2022-10-25 DIAGNOSIS — G894 Chronic pain syndrome: Secondary | ICD-10-CM | POA: Diagnosis not present

## 2022-10-25 DIAGNOSIS — Z6833 Body mass index (BMI) 33.0-33.9, adult: Secondary | ICD-10-CM | POA: Diagnosis not present

## 2022-10-25 DIAGNOSIS — Z23 Encounter for immunization: Secondary | ICD-10-CM | POA: Diagnosis not present

## 2022-10-25 DIAGNOSIS — E6609 Other obesity due to excess calories: Secondary | ICD-10-CM | POA: Diagnosis not present

## 2022-11-25 DIAGNOSIS — F321 Major depressive disorder, single episode, moderate: Secondary | ICD-10-CM | POA: Diagnosis not present

## 2022-11-25 DIAGNOSIS — E785 Hyperlipidemia, unspecified: Secondary | ICD-10-CM | POA: Diagnosis not present

## 2022-11-25 DIAGNOSIS — E559 Vitamin D deficiency, unspecified: Secondary | ICD-10-CM | POA: Diagnosis not present

## 2022-11-25 DIAGNOSIS — Z6833 Body mass index (BMI) 33.0-33.9, adult: Secondary | ICD-10-CM | POA: Diagnosis not present

## 2022-12-26 DIAGNOSIS — G894 Chronic pain syndrome: Secondary | ICD-10-CM | POA: Diagnosis not present

## 2022-12-26 DIAGNOSIS — Z6833 Body mass index (BMI) 33.0-33.9, adult: Secondary | ICD-10-CM | POA: Diagnosis not present

## 2022-12-26 DIAGNOSIS — E6609 Other obesity due to excess calories: Secondary | ICD-10-CM | POA: Diagnosis not present

## 2022-12-26 DIAGNOSIS — J069 Acute upper respiratory infection, unspecified: Secondary | ICD-10-CM | POA: Diagnosis not present

## 2023-01-24 DIAGNOSIS — E559 Vitamin D deficiency, unspecified: Secondary | ICD-10-CM | POA: Diagnosis not present

## 2023-01-24 DIAGNOSIS — F321 Major depressive disorder, single episode, moderate: Secondary | ICD-10-CM | POA: Diagnosis not present

## 2023-01-24 DIAGNOSIS — Z1331 Encounter for screening for depression: Secondary | ICD-10-CM | POA: Diagnosis not present

## 2023-01-24 DIAGNOSIS — E6609 Other obesity due to excess calories: Secondary | ICD-10-CM | POA: Diagnosis not present

## 2023-01-24 DIAGNOSIS — Z6833 Body mass index (BMI) 33.0-33.9, adult: Secondary | ICD-10-CM | POA: Diagnosis not present

## 2023-01-24 DIAGNOSIS — E785 Hyperlipidemia, unspecified: Secondary | ICD-10-CM | POA: Diagnosis not present

## 2023-01-24 DIAGNOSIS — Z0000 Encounter for general adult medical examination without abnormal findings: Secondary | ICD-10-CM | POA: Diagnosis not present

## 2023-01-24 DIAGNOSIS — G894 Chronic pain syndrome: Secondary | ICD-10-CM | POA: Diagnosis not present

## 2023-01-24 DIAGNOSIS — R7309 Other abnormal glucose: Secondary | ICD-10-CM | POA: Diagnosis not present

## 2023-01-24 DIAGNOSIS — I10 Essential (primary) hypertension: Secondary | ICD-10-CM | POA: Diagnosis not present

## 2023-01-24 DIAGNOSIS — G4733 Obstructive sleep apnea (adult) (pediatric): Secondary | ICD-10-CM | POA: Diagnosis not present

## 2023-01-24 DIAGNOSIS — Z9621 Cochlear implant status: Secondary | ICD-10-CM | POA: Diagnosis not present

## 2023-01-28 DIAGNOSIS — N182 Chronic kidney disease, stage 2 (mild): Secondary | ICD-10-CM | POA: Diagnosis not present

## 2023-01-28 DIAGNOSIS — M150 Primary generalized (osteo)arthritis: Secondary | ICD-10-CM | POA: Diagnosis not present

## 2023-01-28 DIAGNOSIS — I10 Essential (primary) hypertension: Secondary | ICD-10-CM | POA: Diagnosis not present

## 2023-01-28 DIAGNOSIS — E1159 Type 2 diabetes mellitus with other circulatory complications: Secondary | ICD-10-CM | POA: Diagnosis not present

## 2023-01-28 DIAGNOSIS — G894 Chronic pain syndrome: Secondary | ICD-10-CM | POA: Diagnosis not present

## 2023-01-28 DIAGNOSIS — E1140 Type 2 diabetes mellitus with diabetic neuropathy, unspecified: Secondary | ICD-10-CM | POA: Diagnosis not present

## 2023-01-28 DIAGNOSIS — Z6838 Body mass index (BMI) 38.0-38.9, adult: Secondary | ICD-10-CM | POA: Diagnosis not present

## 2023-01-28 DIAGNOSIS — I2510 Atherosclerotic heart disease of native coronary artery without angina pectoris: Secondary | ICD-10-CM | POA: Diagnosis not present

## 2023-01-28 DIAGNOSIS — Z6832 Body mass index (BMI) 32.0-32.9, adult: Secondary | ICD-10-CM | POA: Diagnosis not present

## 2023-01-28 DIAGNOSIS — F419 Anxiety disorder, unspecified: Secondary | ICD-10-CM | POA: Diagnosis not present

## 2023-01-28 DIAGNOSIS — E782 Mixed hyperlipidemia: Secondary | ICD-10-CM | POA: Diagnosis not present

## 2023-01-28 DIAGNOSIS — J449 Chronic obstructive pulmonary disease, unspecified: Secondary | ICD-10-CM | POA: Diagnosis not present

## 2023-01-28 DIAGNOSIS — E6609 Other obesity due to excess calories: Secondary | ICD-10-CM | POA: Diagnosis not present

## 2023-02-21 DIAGNOSIS — E6609 Other obesity due to excess calories: Secondary | ICD-10-CM | POA: Diagnosis not present

## 2023-02-21 DIAGNOSIS — F1120 Opioid dependence, uncomplicated: Secondary | ICD-10-CM | POA: Diagnosis not present

## 2023-02-21 DIAGNOSIS — G894 Chronic pain syndrome: Secondary | ICD-10-CM | POA: Diagnosis not present

## 2023-02-21 DIAGNOSIS — Z6833 Body mass index (BMI) 33.0-33.9, adult: Secondary | ICD-10-CM | POA: Diagnosis not present

## 2023-03-24 DIAGNOSIS — Z6832 Body mass index (BMI) 32.0-32.9, adult: Secondary | ICD-10-CM | POA: Diagnosis not present

## 2023-03-24 DIAGNOSIS — E6609 Other obesity due to excess calories: Secondary | ICD-10-CM | POA: Diagnosis not present

## 2023-03-24 DIAGNOSIS — G894 Chronic pain syndrome: Secondary | ICD-10-CM | POA: Diagnosis not present

## 2023-04-23 DIAGNOSIS — Z6832 Body mass index (BMI) 32.0-32.9, adult: Secondary | ICD-10-CM | POA: Diagnosis not present

## 2023-04-23 DIAGNOSIS — E6609 Other obesity due to excess calories: Secondary | ICD-10-CM | POA: Diagnosis not present

## 2023-04-23 DIAGNOSIS — G894 Chronic pain syndrome: Secondary | ICD-10-CM | POA: Diagnosis not present

## 2023-05-28 DIAGNOSIS — E6609 Other obesity due to excess calories: Secondary | ICD-10-CM | POA: Diagnosis not present

## 2023-05-28 DIAGNOSIS — G894 Chronic pain syndrome: Secondary | ICD-10-CM | POA: Diagnosis not present

## 2023-05-28 DIAGNOSIS — Z6831 Body mass index (BMI) 31.0-31.9, adult: Secondary | ICD-10-CM | POA: Diagnosis not present

## 2023-06-23 DIAGNOSIS — R7309 Other abnormal glucose: Secondary | ICD-10-CM | POA: Diagnosis not present

## 2023-06-23 DIAGNOSIS — G894 Chronic pain syndrome: Secondary | ICD-10-CM | POA: Diagnosis not present

## 2023-06-23 DIAGNOSIS — E6609 Other obesity due to excess calories: Secondary | ICD-10-CM | POA: Diagnosis not present

## 2023-06-23 DIAGNOSIS — F1120 Opioid dependence, uncomplicated: Secondary | ICD-10-CM | POA: Diagnosis not present

## 2023-06-23 DIAGNOSIS — Z6832 Body mass index (BMI) 32.0-32.9, adult: Secondary | ICD-10-CM | POA: Diagnosis not present

## 2023-07-24 DIAGNOSIS — Z6831 Body mass index (BMI) 31.0-31.9, adult: Secondary | ICD-10-CM | POA: Diagnosis not present

## 2023-07-24 DIAGNOSIS — E6609 Other obesity due to excess calories: Secondary | ICD-10-CM | POA: Diagnosis not present

## 2023-07-24 DIAGNOSIS — G894 Chronic pain syndrome: Secondary | ICD-10-CM | POA: Diagnosis not present

## 2023-07-24 DIAGNOSIS — F1120 Opioid dependence, uncomplicated: Secondary | ICD-10-CM | POA: Diagnosis not present

## 2023-09-02 DIAGNOSIS — Z6830 Body mass index (BMI) 30.0-30.9, adult: Secondary | ICD-10-CM | POA: Diagnosis not present

## 2023-09-02 DIAGNOSIS — Z0000 Encounter for general adult medical examination without abnormal findings: Secondary | ICD-10-CM | POA: Diagnosis not present

## 2023-09-02 DIAGNOSIS — G894 Chronic pain syndrome: Secondary | ICD-10-CM | POA: Diagnosis not present

## 2023-09-02 DIAGNOSIS — Z1331 Encounter for screening for depression: Secondary | ICD-10-CM | POA: Diagnosis not present

## 2023-09-02 DIAGNOSIS — F1120 Opioid dependence, uncomplicated: Secondary | ICD-10-CM | POA: Diagnosis not present

## 2023-09-02 DIAGNOSIS — E6609 Other obesity due to excess calories: Secondary | ICD-10-CM | POA: Diagnosis not present

## 2023-10-10 ENCOUNTER — Other Ambulatory Visit (HOSPITAL_COMMUNITY): Payer: Self-pay | Admitting: Family Medicine

## 2023-10-10 DIAGNOSIS — F1120 Opioid dependence, uncomplicated: Secondary | ICD-10-CM | POA: Diagnosis not present

## 2023-10-10 DIAGNOSIS — Z1231 Encounter for screening mammogram for malignant neoplasm of breast: Secondary | ICD-10-CM

## 2023-10-10 DIAGNOSIS — E6609 Other obesity due to excess calories: Secondary | ICD-10-CM | POA: Diagnosis not present

## 2023-10-10 DIAGNOSIS — G894 Chronic pain syndrome: Secondary | ICD-10-CM | POA: Diagnosis not present

## 2023-10-10 DIAGNOSIS — Z23 Encounter for immunization: Secondary | ICD-10-CM | POA: Diagnosis not present

## 2023-10-10 DIAGNOSIS — Z6830 Body mass index (BMI) 30.0-30.9, adult: Secondary | ICD-10-CM | POA: Diagnosis not present

## 2023-10-24 ENCOUNTER — Ambulatory Visit (HOSPITAL_COMMUNITY)
Admission: RE | Admit: 2023-10-24 | Discharge: 2023-10-24 | Disposition: A | Discharge status: Home / Self Care | Payer: Medicare Other | Source: Ambulatory Visit | Attending: Family Medicine | Admitting: Family Medicine

## 2023-10-24 DIAGNOSIS — Z1231 Encounter for screening mammogram for malignant neoplasm of breast: Secondary | ICD-10-CM | POA: Diagnosis not present

## 2023-11-14 DIAGNOSIS — E6609 Other obesity due to excess calories: Secondary | ICD-10-CM | POA: Diagnosis not present

## 2023-11-14 DIAGNOSIS — G894 Chronic pain syndrome: Secondary | ICD-10-CM | POA: Diagnosis not present

## 2023-11-14 DIAGNOSIS — F1120 Opioid dependence, uncomplicated: Secondary | ICD-10-CM | POA: Diagnosis not present

## 2023-11-14 DIAGNOSIS — Z6830 Body mass index (BMI) 30.0-30.9, adult: Secondary | ICD-10-CM | POA: Diagnosis not present

## 2023-12-01 DIAGNOSIS — H524 Presbyopia: Secondary | ICD-10-CM | POA: Diagnosis not present

## 2023-12-01 DIAGNOSIS — H353131 Nonexudative age-related macular degeneration, bilateral, early dry stage: Secondary | ICD-10-CM | POA: Diagnosis not present

## 2023-12-05 DIAGNOSIS — E6609 Other obesity due to excess calories: Secondary | ICD-10-CM | POA: Diagnosis not present

## 2023-12-05 DIAGNOSIS — Z6829 Body mass index (BMI) 29.0-29.9, adult: Secondary | ICD-10-CM | POA: Diagnosis not present

## 2023-12-05 DIAGNOSIS — F1120 Opioid dependence, uncomplicated: Secondary | ICD-10-CM | POA: Diagnosis not present

## 2023-12-05 DIAGNOSIS — G894 Chronic pain syndrome: Secondary | ICD-10-CM | POA: Diagnosis not present

## 2024-01-02 DIAGNOSIS — E6609 Other obesity due to excess calories: Secondary | ICD-10-CM | POA: Diagnosis not present

## 2024-01-02 DIAGNOSIS — M13852 Other specified arthritis, left hip: Secondary | ICD-10-CM | POA: Diagnosis not present

## 2024-01-02 DIAGNOSIS — Z6830 Body mass index (BMI) 30.0-30.9, adult: Secondary | ICD-10-CM | POA: Diagnosis not present

## 2024-01-02 DIAGNOSIS — G894 Chronic pain syndrome: Secondary | ICD-10-CM | POA: Diagnosis not present

## 2024-02-06 DIAGNOSIS — G894 Chronic pain syndrome: Secondary | ICD-10-CM | POA: Diagnosis not present

## 2024-02-06 DIAGNOSIS — E6609 Other obesity due to excess calories: Secondary | ICD-10-CM | POA: Diagnosis not present

## 2024-02-06 DIAGNOSIS — E785 Hyperlipidemia, unspecified: Secondary | ICD-10-CM | POA: Diagnosis not present

## 2024-02-06 DIAGNOSIS — Z6829 Body mass index (BMI) 29.0-29.9, adult: Secondary | ICD-10-CM | POA: Diagnosis not present

## 2024-02-06 DIAGNOSIS — M1611 Unilateral primary osteoarthritis, right hip: Secondary | ICD-10-CM | POA: Diagnosis not present

## 2024-03-22 DIAGNOSIS — E6609 Other obesity due to excess calories: Secondary | ICD-10-CM | POA: Diagnosis not present

## 2024-03-22 DIAGNOSIS — G894 Chronic pain syndrome: Secondary | ICD-10-CM | POA: Diagnosis not present

## 2024-03-22 DIAGNOSIS — Z6830 Body mass index (BMI) 30.0-30.9, adult: Secondary | ICD-10-CM | POA: Diagnosis not present

## 2024-03-22 DIAGNOSIS — M1990 Unspecified osteoarthritis, unspecified site: Secondary | ICD-10-CM | POA: Diagnosis not present

## 2024-04-19 DIAGNOSIS — G894 Chronic pain syndrome: Secondary | ICD-10-CM | POA: Diagnosis not present

## 2024-04-19 DIAGNOSIS — E6609 Other obesity due to excess calories: Secondary | ICD-10-CM | POA: Diagnosis not present

## 2024-04-19 DIAGNOSIS — R7309 Other abnormal glucose: Secondary | ICD-10-CM | POA: Diagnosis not present

## 2024-04-19 DIAGNOSIS — Z6830 Body mass index (BMI) 30.0-30.9, adult: Secondary | ICD-10-CM | POA: Diagnosis not present

## 2024-04-19 DIAGNOSIS — F1120 Opioid dependence, uncomplicated: Secondary | ICD-10-CM | POA: Diagnosis not present

## 2024-04-19 DIAGNOSIS — M1611 Unilateral primary osteoarthritis, right hip: Secondary | ICD-10-CM | POA: Diagnosis not present

## 2024-05-19 DIAGNOSIS — E663 Overweight: Secondary | ICD-10-CM | POA: Diagnosis not present

## 2024-05-19 DIAGNOSIS — G894 Chronic pain syndrome: Secondary | ICD-10-CM | POA: Diagnosis not present

## 2024-05-19 DIAGNOSIS — Z6829 Body mass index (BMI) 29.0-29.9, adult: Secondary | ICD-10-CM | POA: Diagnosis not present

## 2024-06-18 DIAGNOSIS — E6609 Other obesity due to excess calories: Secondary | ICD-10-CM | POA: Diagnosis not present

## 2024-06-18 DIAGNOSIS — G4733 Obstructive sleep apnea (adult) (pediatric): Secondary | ICD-10-CM | POA: Diagnosis not present

## 2024-06-18 DIAGNOSIS — E559 Vitamin D deficiency, unspecified: Secondary | ICD-10-CM | POA: Diagnosis not present

## 2024-06-18 DIAGNOSIS — G894 Chronic pain syndrome: Secondary | ICD-10-CM | POA: Diagnosis not present

## 2024-06-18 DIAGNOSIS — Z6829 Body mass index (BMI) 29.0-29.9, adult: Secondary | ICD-10-CM | POA: Diagnosis not present

## 2024-06-18 DIAGNOSIS — F1120 Opioid dependence, uncomplicated: Secondary | ICD-10-CM | POA: Diagnosis not present

## 2024-06-18 DIAGNOSIS — E785 Hyperlipidemia, unspecified: Secondary | ICD-10-CM | POA: Diagnosis not present

## 2024-06-18 DIAGNOSIS — I10 Essential (primary) hypertension: Secondary | ICD-10-CM | POA: Diagnosis not present

## 2024-07-09 DIAGNOSIS — E785 Hyperlipidemia, unspecified: Secondary | ICD-10-CM | POA: Diagnosis not present

## 2024-07-09 DIAGNOSIS — I10 Essential (primary) hypertension: Secondary | ICD-10-CM | POA: Diagnosis not present

## 2024-07-09 DIAGNOSIS — E559 Vitamin D deficiency, unspecified: Secondary | ICD-10-CM | POA: Diagnosis not present

## 2024-07-09 DIAGNOSIS — F1120 Opioid dependence, uncomplicated: Secondary | ICD-10-CM | POA: Diagnosis not present

## 2024-07-16 DIAGNOSIS — G894 Chronic pain syndrome: Secondary | ICD-10-CM | POA: Diagnosis not present

## 2024-07-16 DIAGNOSIS — Z6828 Body mass index (BMI) 28.0-28.9, adult: Secondary | ICD-10-CM | POA: Diagnosis not present

## 2024-07-16 DIAGNOSIS — F1120 Opioid dependence, uncomplicated: Secondary | ICD-10-CM | POA: Diagnosis not present

## 2024-07-16 DIAGNOSIS — E663 Overweight: Secondary | ICD-10-CM | POA: Diagnosis not present

## 2024-08-17 DIAGNOSIS — G894 Chronic pain syndrome: Secondary | ICD-10-CM | POA: Diagnosis not present

## 2024-08-17 DIAGNOSIS — E663 Overweight: Secondary | ICD-10-CM | POA: Diagnosis not present

## 2024-08-17 DIAGNOSIS — Z6829 Body mass index (BMI) 29.0-29.9, adult: Secondary | ICD-10-CM | POA: Diagnosis not present

## 2024-12-06 ENCOUNTER — Other Ambulatory Visit (HOSPITAL_COMMUNITY): Payer: Self-pay | Admitting: Family Medicine

## 2024-12-06 DIAGNOSIS — Z1231 Encounter for screening mammogram for malignant neoplasm of breast: Secondary | ICD-10-CM

## 2025-01-05 ENCOUNTER — Ambulatory Visit (HOSPITAL_COMMUNITY)
